# Patient Record
Sex: Female | Born: 1975 | Race: White | Hispanic: No | Marital: Married
Health system: Midwestern US, Academic
[De-identification: ages and names within clinical notes are randomized; demographics above are authoritative.]

---

## 2016-12-03 ENCOUNTER — Encounter: Admit: 2016-12-03 | Discharge: 2016-12-04 | Payer: BC Managed Care – PPO

## 2016-12-03 ENCOUNTER — Encounter: Admit: 2016-12-03 | Discharge: 2016-12-03 | Payer: BC Managed Care – PPO

## 2017-08-02 ENCOUNTER — Encounter: Admit: 2017-08-02 | Discharge: 2017-08-02 | Payer: BC Managed Care – PPO

## 2017-08-02 ENCOUNTER — Ambulatory Visit: Admit: 2017-08-02 | Discharge: 2017-08-02 | Payer: BC Managed Care – PPO

## 2017-08-02 DIAGNOSIS — H40052 Ocular hypertension, left eye: ICD-10-CM

## 2017-08-02 DIAGNOSIS — H35063 Retinal vasculitis, bilateral: Principal | ICD-10-CM

## 2017-08-02 MED ORDER — BRINZOLAMIDE 1 % OP DRPS
1 [drp] | Freq: Three times a day (TID) | OPHTHALMIC | 3 refills | 90.00000 days | Status: AC
Start: 2017-08-02 — End: 2018-07-12

## 2017-08-05 ENCOUNTER — Encounter: Admit: 2017-08-05 | Discharge: 2017-08-05 | Payer: BC Managed Care – PPO

## 2017-08-05 ENCOUNTER — Ambulatory Visit: Admit: 2017-08-05 | Discharge: 2017-08-05 | Payer: BC Managed Care – PPO

## 2017-08-05 DIAGNOSIS — H33321 Round hole, right eye: Principal | ICD-10-CM

## 2017-08-05 DIAGNOSIS — H33311 Horseshoe tear of retina without detachment, right eye: ICD-10-CM

## 2017-08-05 DIAGNOSIS — H40052 Ocular hypertension, left eye: ICD-10-CM

## 2017-08-05 DIAGNOSIS — H35063 Retinal vasculitis, bilateral: Principal | ICD-10-CM

## 2017-08-05 DIAGNOSIS — Z87891 Personal history of nicotine dependence: ICD-10-CM

## 2017-08-05 DIAGNOSIS — Z881 Allergy status to other antibiotic agents status: ICD-10-CM

## 2017-08-05 MED ORDER — TETRACAINE HCL (PF) 0.5 % OP DROP
0 refills | Status: DC
Start: 2017-08-05 — End: 2017-08-05
  Administered 2017-08-05: 18:00:00 1 [drp] via OPHTHALMIC

## 2017-08-05 MED ORDER — TROPICAMIDE 1 % OP DROP
1 [drp] | 0 refills | Status: CN
Start: 2017-08-05 — End: ?

## 2017-08-05 MED ORDER — PHENYLEPHRINE HCL 2.5 % OP DROP
1 [drp] | 0 refills | Status: CN
Start: 2017-08-05 — End: ?

## 2017-08-05 MED ORDER — CYCLOPENTOLATE 1 % OP DROP
1 [drp] | 0 refills | Status: CN
Start: 2017-08-05 — End: ?

## 2017-08-09 ENCOUNTER — Encounter: Admit: 2017-08-09 | Discharge: 2017-08-09 | Payer: BC Managed Care – PPO

## 2017-08-12 ENCOUNTER — Encounter: Admit: 2017-08-12 | Discharge: 2017-08-12 | Payer: BC Managed Care – PPO

## 2017-08-18 ENCOUNTER — Encounter: Admit: 2017-08-18 | Discharge: 2017-08-18 | Payer: BC Managed Care – PPO

## 2017-12-08 ENCOUNTER — Encounter: Admit: 2017-12-08 | Discharge: 2017-12-08 | Payer: BC Managed Care – PPO

## 2017-12-08 ENCOUNTER — Ambulatory Visit: Admit: 2017-12-08 | Discharge: 2017-12-09 | Payer: BC Managed Care – PPO

## 2017-12-08 DIAGNOSIS — H4052X1 Glaucoma secondary to other eye disorders, left eye, mild stage: ICD-10-CM

## 2017-12-08 DIAGNOSIS — H35063 Retinal vasculitis, bilateral: Principal | ICD-10-CM

## 2017-12-09 DIAGNOSIS — H5702 Anisocoria: ICD-10-CM

## 2017-12-26 ENCOUNTER — Encounter: Admit: 2017-12-26 | Discharge: 2017-12-26 | Payer: BC Managed Care – PPO

## 2017-12-26 ENCOUNTER — Ambulatory Visit: Admit: 2017-12-26 | Discharge: 2017-12-27 | Payer: BC Managed Care – PPO

## 2017-12-27 DIAGNOSIS — H40042 Steroid responder, left eye: Principal | ICD-10-CM

## 2017-12-29 ENCOUNTER — Encounter: Admit: 2017-12-29 | Discharge: 2017-12-29 | Payer: BC Managed Care – PPO

## 2017-12-30 ENCOUNTER — Encounter: Admit: 2017-12-30 | Discharge: 2017-12-30 | Payer: BC Managed Care – PPO

## 2018-01-02 ENCOUNTER — Encounter: Admit: 2018-01-02 | Discharge: 2018-01-02 | Payer: BC Managed Care – PPO

## 2018-01-02 DIAGNOSIS — T1592XA Foreign body on external eye, part unspecified, left eye, initial encounter: Principal | ICD-10-CM

## 2018-01-03 ENCOUNTER — Encounter: Admit: 2018-01-03 | Discharge: 2018-01-03 | Payer: BC Managed Care – PPO

## 2018-01-03 DIAGNOSIS — B399 Histoplasmosis, unspecified: Principal | ICD-10-CM

## 2018-01-04 ENCOUNTER — Encounter: Admit: 2018-01-04 | Discharge: 2018-01-04 | Payer: BC Managed Care – PPO

## 2018-01-04 ENCOUNTER — Ambulatory Visit: Admit: 2018-01-04 | Discharge: 2018-01-04 | Payer: BC Managed Care – PPO

## 2018-01-04 DIAGNOSIS — T380X5A Adverse effect of glucocorticoids and synthetic analogues, initial encounter: ICD-10-CM

## 2018-01-04 DIAGNOSIS — Z881 Allergy status to other antibiotic agents status: ICD-10-CM

## 2018-01-04 DIAGNOSIS — H40052 Ocular hypertension, left eye: ICD-10-CM

## 2018-01-04 DIAGNOSIS — H4062X0 Glaucoma secondary to drugs, left eye, stage unspecified: ICD-10-CM

## 2018-01-04 DIAGNOSIS — Z87891 Personal history of nicotine dependence: ICD-10-CM

## 2018-01-04 DIAGNOSIS — B399 Histoplasmosis, unspecified: Principal | ICD-10-CM

## 2018-01-04 DIAGNOSIS — T1582XA Foreign body in other and multiple parts of external eye, left eye, initial encounter: Principal | ICD-10-CM

## 2018-01-04 MED ORDER — TETRACAINE HCL (PF) 0.5 % OP DROP
0 refills | Status: DC
Start: 2018-01-04 — End: 2018-01-04
  Administered 2018-01-04: 15:00:00 3 [drp] via OPHTHALMIC

## 2018-01-04 MED ORDER — FENTANYL CITRATE (PF) 50 MCG/ML IJ SOLN
0 refills | Status: DC
Start: 2018-01-04 — End: 2018-01-04
  Administered 2018-01-04 (×2): 50 ug via INTRAVENOUS

## 2018-01-04 MED ORDER — BALANCED SALT SOLN NO.2 IRRIG. IO SOLN
0 refills | Status: DC
Start: 2018-01-04 — End: 2018-01-04
  Administered 2018-01-04: 16:00:00 15 mL via OPHTHALMIC

## 2018-01-04 MED ORDER — TOBRAMYCIN-DEXAMETHASONE 0.3-0.1 % OP OINT
0 refills | Status: DC
Start: 2018-01-04 — End: 2018-01-04
  Administered 2018-01-04: 16:00:00 0.5 [in_us] via OPHTHALMIC

## 2018-01-04 MED ORDER — TETRACAINE HCL (PF) 0.5 % OP DROP
1 [drp] | Freq: Once | OPHTHALMIC | 0 refills | Status: CP
Start: 2018-01-04 — End: ?
  Administered 2018-01-04: 15:00:00 1 [drp] via OPHTHALMIC

## 2018-01-04 MED ORDER — LIDOCAINE (PF) 10 MG/ML (1 %) IJ SOLN
0 refills | Status: DC
Start: 2018-01-04 — End: 2018-01-04
  Administered 2018-01-04: 16:00:00 .2 mL via INTRAMUSCULAR

## 2018-01-04 MED ORDER — MIDAZOLAM 1 MG/ML IJ SOLN
INTRAVENOUS | 0 refills | Status: DC
Start: 2018-01-04 — End: 2018-01-04
  Administered 2018-01-04 (×2): 2 mg via INTRAVENOUS

## 2018-01-04 MED ORDER — MOXIFLOXACIN 0.5 % OP DROP
1 [drp] | OPHTHALMIC | 0 refills | Status: CP
Start: 2018-01-04 — End: ?
  Administered 2018-01-04: 15:00:00 1 [drp] via OPHTHALMIC

## 2018-01-05 ENCOUNTER — Encounter: Admit: 2018-01-05 | Discharge: 2018-01-05 | Payer: BC Managed Care – PPO

## 2018-01-06 ENCOUNTER — Encounter: Admit: 2018-01-06 | Discharge: 2018-01-06 | Payer: BC Managed Care – PPO

## 2018-01-06 DIAGNOSIS — B399 Histoplasmosis, unspecified: Principal | ICD-10-CM

## 2018-01-19 ENCOUNTER — Ambulatory Visit: Admit: 2018-01-19 | Discharge: 2018-01-20 | Payer: BC Managed Care – PPO

## 2018-01-19 ENCOUNTER — Encounter: Admit: 2018-01-19 | Discharge: 2018-01-19 | Payer: BC Managed Care – PPO

## 2018-01-19 DIAGNOSIS — H04123 Dry eye syndrome of bilateral lacrimal glands: ICD-10-CM

## 2018-01-19 DIAGNOSIS — H35063 Retinal vasculitis, bilateral: ICD-10-CM

## 2018-01-19 DIAGNOSIS — H40042 Steroid responder, left eye: Principal | ICD-10-CM

## 2018-01-19 DIAGNOSIS — B399 Histoplasmosis, unspecified: Principal | ICD-10-CM

## 2018-01-23 ENCOUNTER — Ambulatory Visit: Admit: 2018-01-23 | Discharge: 2018-01-24 | Payer: BC Managed Care – PPO

## 2018-01-23 ENCOUNTER — Encounter: Admit: 2018-01-23 | Discharge: 2018-01-23 | Payer: BC Managed Care – PPO

## 2018-01-23 DIAGNOSIS — H33311 Horseshoe tear of retina without detachment, right eye: ICD-10-CM

## 2018-01-23 DIAGNOSIS — B399 Histoplasmosis, unspecified: Principal | ICD-10-CM

## 2018-01-23 MED ORDER — FLUORESCEIN 500 MG/2 ML (25 %) IV SOLN
2 mL | Freq: Once | INTRAVENOUS | 0 refills | Status: CP
Start: 2018-01-23 — End: ?
  Administered 2018-01-23: 23:00:00 2 mL via INTRAVENOUS

## 2018-01-24 DIAGNOSIS — H35063 Retinal vasculitis, bilateral: Principal | ICD-10-CM

## 2018-02-07 ENCOUNTER — Ambulatory Visit: Admit: 2018-02-07 | Discharge: 2018-02-08 | Payer: BC Managed Care – PPO

## 2018-02-07 ENCOUNTER — Encounter: Admit: 2018-02-07 | Discharge: 2018-02-07 | Payer: BC Managed Care – PPO

## 2018-02-07 DIAGNOSIS — H4052X1 Glaucoma secondary to other eye disorders, left eye, mild stage: Principal | ICD-10-CM

## 2018-02-07 DIAGNOSIS — H04123 Dry eye syndrome of bilateral lacrimal glands: ICD-10-CM

## 2018-02-07 DIAGNOSIS — B399 Histoplasmosis, unspecified: Principal | ICD-10-CM

## 2018-02-07 DIAGNOSIS — H40042 Steroid responder, left eye: ICD-10-CM

## 2018-02-07 DIAGNOSIS — H25042 Posterior subcapsular polar age-related cataract, left eye: ICD-10-CM

## 2018-02-07 MED ORDER — DIFLUPREDNATE 0.05 % OP DROP
1 [drp] | Freq: Four times a day (QID) | OPHTHALMIC | 3 refills | 19.00000 days | Status: AC
Start: 2018-02-07 — End: 2018-07-12

## 2018-02-07 MED ORDER — ACETAZOLAMIDE 500 MG PO CPER
500 mg | ORAL_CAPSULE | Freq: Two times a day (BID) | ORAL | 1 refills | 12.50000 days | Status: AC
Start: 2018-02-07 — End: ?

## 2018-02-08 DIAGNOSIS — H4052X1 Glaucoma secondary to other eye disorders, left eye, mild stage: Principal | ICD-10-CM

## 2018-02-13 DIAGNOSIS — H40052 Ocular hypertension, left eye: Secondary | ICD-10-CM

## 2018-02-23 ENCOUNTER — Encounter: Admit: 2018-02-23 | Discharge: 2018-02-23 | Payer: BC Managed Care – PPO

## 2018-02-23 DIAGNOSIS — B399 Histoplasmosis, unspecified: Principal | ICD-10-CM

## 2018-02-28 ENCOUNTER — Encounter: Admit: 2018-02-28 | Discharge: 2018-02-28 | Payer: BC Managed Care – PPO

## 2018-03-01 ENCOUNTER — Encounter: Admit: 2018-03-01 | Discharge: 2018-03-01 | Payer: BC Managed Care – PPO

## 2018-03-01 ENCOUNTER — Ambulatory Visit: Admit: 2018-03-01 | Discharge: 2018-03-01 | Payer: BC Managed Care – PPO

## 2018-03-01 DIAGNOSIS — H4052X1 Glaucoma secondary to other eye disorders, left eye, mild stage: Principal | ICD-10-CM

## 2018-03-01 DIAGNOSIS — Z87891 Personal history of nicotine dependence: ICD-10-CM

## 2018-03-01 DIAGNOSIS — Z881 Allergy status to other antibiotic agents status: ICD-10-CM

## 2018-03-01 DIAGNOSIS — H40042 Steroid responder, left eye: ICD-10-CM

## 2018-03-01 DIAGNOSIS — H40052 Ocular hypertension, left eye: ICD-10-CM

## 2018-03-01 DIAGNOSIS — Z90710 Acquired absence of both cervix and uterus: ICD-10-CM

## 2018-03-01 DIAGNOSIS — H25042 Posterior subcapsular polar age-related cataract, left eye: ICD-10-CM

## 2018-03-01 DIAGNOSIS — B399 Histoplasmosis, unspecified: Principal | ICD-10-CM

## 2018-03-01 MED ORDER — FLUORESCEIN 1 MG OP STRP
0 refills | Status: DC
Start: 2018-03-01 — End: 2018-03-01
  Administered 2018-03-01: 19:00:00 1 via OPHTHALMIC

## 2018-03-01 MED ORDER — CARBACHOL 0.01 % IO SOLN
0 refills | Status: DC
Start: 2018-03-01 — End: 2018-03-01
  Administered 2018-03-01: 18:00:00 1 mL via INTRAOCULAR

## 2018-03-01 MED ORDER — MITOMYCIN 0.4MG/ML OPHTH SYR
.4 mg | Freq: Once | SUBCONJUNCTIVAL | 0 refills | Status: CP
Start: 2018-03-01 — End: ?
  Administered 2018-03-01: 19:00:00 0.4 mg via SUBCONJUNCTIVAL

## 2018-03-01 MED ORDER — TOBRAMYCIN-DEXAMETHASONE 0.3-0.1 % OP OINT
0 refills | Status: DC
Start: 2018-03-01 — End: 2018-03-01
  Administered 2018-03-01: 19:00:00 0.5 [in_us] via OPHTHALMIC

## 2018-03-01 MED ORDER — MIDAZOLAM 1 MG/ML IJ SOLN
INTRAVENOUS | 0 refills | Status: DC
Start: 2018-03-01 — End: 2018-03-01
  Administered 2018-03-01 (×4): 1 mg via INTRAVENOUS
  Administered 2018-03-01: 18:00:00 2 mg via INTRAVENOUS

## 2018-03-01 MED ORDER — OBRIEN DILATION WITH CIPRO MIXTURE
1 [drp] | OPHTHALMIC | 0 refills | Status: DC
Start: 2018-03-01 — End: 2018-03-01
  Administered 2018-03-01: 15:00:00 1 [drp] via OPHTHALMIC

## 2018-03-01 MED ORDER — MOXIFLOXACIN 0.5 % OP DROP
0 refills | Status: DC
Start: 2018-03-01 — End: 2018-03-01
  Administered 2018-03-01: 20:00:00 0.1 mL via INTRACAMERAL

## 2018-03-01 MED ORDER — SODIUM HYALURONATE 10 MG/ML IO SYRG
0 refills | Status: DC
Start: 2018-03-01 — End: 2018-03-01
  Administered 2018-03-01 (×2): 0.85 mL via OPHTHALMIC

## 2018-03-01 MED ORDER — BUPIVACAINE/LIDOCAINE SYRINGE
0 refills | Status: DC
Start: 2018-03-01 — End: 2018-03-01
  Administered 2018-03-01 (×2): 3 mL via RETROBULBAR

## 2018-03-01 MED ORDER — DEXAMETHASONE SODIUM PHOSPHATE 4 MG/ML IJ SOLN
0 refills | Status: DC
Start: 2018-03-01 — End: 2018-03-01
  Administered 2018-03-01: 19:00:00 2 mg via SUBCONJUNCTIVAL

## 2018-03-01 MED ORDER — FENTANYL CITRATE (PF) 50 MCG/ML IJ SOLN
0 refills | Status: DC
Start: 2018-03-01 — End: 2018-03-01
  Administered 2018-03-01: 19:00:00 25 ug via INTRAVENOUS
  Administered 2018-03-01: 18:00:00 50 ug via INTRAVENOUS
  Administered 2018-03-01 (×2): 25 ug via INTRAVENOUS
  Administered 2018-03-01: 19:00:00 50 ug via INTRAVENOUS

## 2018-03-01 MED ORDER — VANCOMYCIN(#) INTRAVITREAL INJ 1MG/0.1ML
0 refills | Status: DC
Start: 2018-03-01 — End: 2018-03-01
  Administered 2018-03-01: 20:00:00 1 mg via INTRAVITREAL

## 2018-03-01 MED ORDER — BALANCED SALT SOLN NO.2 IRRIG. IO SOLN
0 refills | Status: DC
Start: 2018-03-01 — End: 2018-03-01
  Administered 2018-03-01: 18:00:00 15 mL via OPHTHALMIC

## 2018-03-01 MED ORDER — EPINEPHRINE 0.3 MG IN ISOTONIC OPHTH IRR(BSS)
0 refills | Status: DC
Start: 2018-03-01 — End: 2018-03-01
  Administered 2018-03-01 (×2): 500 mL via INTRAOCULAR

## 2018-03-01 MED ORDER — TETRACAINE HCL (PF) 0.5 % OP DROP
0 refills | Status: DC
Start: 2018-03-01 — End: 2018-03-01
  Administered 2018-03-01: 18:00:00 1 [drp] via OPHTHALMIC

## 2018-03-01 MED ORDER — TETRACAINE HCL (PF) 0.5 % OP DROP
1 [drp] | Freq: Once | OPHTHALMIC | 0 refills | Status: CP
Start: 2018-03-01 — End: ?
  Administered 2018-03-01: 15:00:00 1 [drp] via OPHTHALMIC

## 2018-03-01 MED ORDER — CHONDROITIN SULF-SOD HYALURON 3 %-4 %(0.5 ML) 1 % (0.55 ML) IO SYRG
0 refills | Status: DC
Start: 2018-03-01 — End: 2018-03-01
  Administered 2018-03-01: 18:00:00 1 via INTRAOCULAR

## 2018-03-02 ENCOUNTER — Ambulatory Visit: Admit: 2018-03-02 | Discharge: 2018-03-03 | Payer: BC Managed Care – PPO

## 2018-03-02 DIAGNOSIS — H40042 Steroid responder, left eye: ICD-10-CM

## 2018-03-02 DIAGNOSIS — H4052X1 Glaucoma secondary to other eye disorders, left eye, mild stage: Principal | ICD-10-CM

## 2018-03-03 ENCOUNTER — Ambulatory Visit: Admit: 2018-03-03 | Discharge: 2018-03-04 | Payer: BC Managed Care – PPO

## 2018-03-03 ENCOUNTER — Encounter: Admit: 2018-03-03 | Discharge: 2018-03-03 | Payer: BC Managed Care – PPO

## 2018-03-03 DIAGNOSIS — H40052 Ocular hypertension, left eye: ICD-10-CM

## 2018-03-03 DIAGNOSIS — H4052X1 Glaucoma secondary to other eye disorders, left eye, mild stage: Principal | ICD-10-CM

## 2018-03-03 DIAGNOSIS — B399 Histoplasmosis, unspecified: Principal | ICD-10-CM

## 2018-03-03 MED ORDER — ATROPINE 1 % OP DROP
1 [drp] | Freq: Two times a day (BID) | OPHTHALMIC | 0 refills | 18.00000 days | Status: AC
Start: 2018-03-03 — End: 2018-07-12

## 2018-03-07 ENCOUNTER — Ambulatory Visit: Admit: 2018-03-07 | Discharge: 2018-03-08 | Payer: BC Managed Care – PPO

## 2018-03-07 ENCOUNTER — Encounter: Admit: 2018-03-07 | Discharge: 2018-03-07 | Payer: BC Managed Care – PPO

## 2018-03-07 DIAGNOSIS — H4052X1 Glaucoma secondary to other eye disorders, left eye, mild stage: Principal | ICD-10-CM

## 2018-03-07 DIAGNOSIS — B399 Histoplasmosis, unspecified: Principal | ICD-10-CM

## 2018-03-13 ENCOUNTER — Ambulatory Visit: Admit: 2018-03-13 | Discharge: 2018-03-14 | Payer: BC Managed Care – PPO

## 2018-03-13 ENCOUNTER — Encounter: Admit: 2018-03-13 | Discharge: 2018-03-13 | Payer: BC Managed Care – PPO

## 2018-03-13 DIAGNOSIS — B399 Histoplasmosis, unspecified: Principal | ICD-10-CM

## 2018-03-13 DIAGNOSIS — H4052X1 Glaucoma secondary to other eye disorders, left eye, mild stage: Principal | ICD-10-CM

## 2018-03-13 DIAGNOSIS — H40042 Steroid responder, left eye: ICD-10-CM

## 2018-03-27 ENCOUNTER — Encounter: Admit: 2018-03-27 | Discharge: 2018-03-27 | Payer: BC Managed Care – PPO

## 2018-03-27 ENCOUNTER — Ambulatory Visit: Admit: 2018-03-27 | Discharge: 2018-03-28 | Payer: BC Managed Care – PPO

## 2018-03-27 DIAGNOSIS — H40042 Steroid responder, left eye: Secondary | ICD-10-CM

## 2018-03-27 DIAGNOSIS — Z9883 Filtering (vitreous) bleb after glaucoma surgery status: Secondary | ICD-10-CM

## 2018-03-27 DIAGNOSIS — H4052X1 Glaucoma secondary to other eye disorders, left eye, mild stage: Secondary | ICD-10-CM

## 2018-03-27 DIAGNOSIS — H40052 Ocular hypertension, left eye: Secondary | ICD-10-CM

## 2018-03-27 DIAGNOSIS — B399 Histoplasmosis, unspecified: Secondary | ICD-10-CM

## 2018-04-11 ENCOUNTER — Encounter: Admit: 2018-04-11 | Discharge: 2018-04-11 | Payer: BC Managed Care – PPO

## 2018-04-11 ENCOUNTER — Ambulatory Visit: Admit: 2018-04-11 | Discharge: 2018-04-12 | Payer: BC Managed Care – PPO

## 2018-04-11 DIAGNOSIS — H527 Unspecified disorder of refraction: ICD-10-CM

## 2018-04-11 DIAGNOSIS — H40052 Ocular hypertension, left eye: ICD-10-CM

## 2018-04-11 DIAGNOSIS — B399 Histoplasmosis, unspecified: Secondary | ICD-10-CM

## 2018-04-11 DIAGNOSIS — H35063 Retinal vasculitis, bilateral: ICD-10-CM

## 2018-04-11 MED ORDER — MOXIFLOXACIN 0.5 % OP DROP
1 [drp] | Freq: Four times a day (QID) | OPHTHALMIC | 0 refills | 7.00000 days | Status: AC
Start: 2018-04-11 — End: ?

## 2018-04-12 DIAGNOSIS — T85612A Breakdown (mechanical) of permanent sutures, initial encounter: Principal | ICD-10-CM

## 2018-05-11 ENCOUNTER — Encounter: Admit: 2018-05-11 | Discharge: 2018-05-11 | Payer: BC Managed Care – PPO

## 2018-05-12 ENCOUNTER — Ambulatory Visit: Admit: 2018-05-12 | Discharge: 2018-05-13 | Payer: BC Managed Care – PPO

## 2018-05-12 ENCOUNTER — Encounter: Admit: 2018-05-12 | Discharge: 2018-05-12 | Payer: BC Managed Care – PPO

## 2018-05-12 DIAGNOSIS — B399 Histoplasmosis, unspecified: Principal | ICD-10-CM

## 2018-05-12 NOTE — Progress Notes
There is no height or weight on file to calculate BMI.              Assessment and Plan:  A/ 1. Anisometropia    -OD low moderate myopia, OS high degree of astigmatism    -History of CE with PCIOL OS, had temporal corneal suture. Removed by Dr. Elpidio Anis    -post removal still has high degree of astigmatism, but regular    -Able to attain better than expect BCVA OS on refraction. Able to attain 20/20- OS         P/ 1. Prescription for glasses provided to patient. Discussed with patient potential for difficulty for adaption to glasses due to high degree of anisometropia. Advised witting Rx for SV glasses. Near add indicated OS, but would be very difficult to adapt to in glasses. Will forgo bifocal OS at this time. Advised can return to reassess glasses PRN if having difficulty adapting to glasses. Educated on potential for contact lenses, may reduce visual distortion posed by aniso. Patient very apprehensive to contact lens. Monitor PRN.

## 2018-05-13 DIAGNOSIS — H5231 Anisometropia: Principal | ICD-10-CM

## 2018-07-12 ENCOUNTER — Ambulatory Visit: Admit: 2018-07-12 | Discharge: 2018-07-13 | Payer: BC Managed Care – PPO

## 2018-07-12 ENCOUNTER — Encounter: Admit: 2018-07-12 | Discharge: 2018-07-12 | Payer: BC Managed Care – PPO

## 2018-07-12 DIAGNOSIS — H4052X1 Glaucoma secondary to other eye disorders, left eye, mild stage: Principal | ICD-10-CM

## 2018-07-12 DIAGNOSIS — H40042 Steroid responder, left eye: ICD-10-CM

## 2018-07-12 DIAGNOSIS — B399 Histoplasmosis, unspecified: Principal | ICD-10-CM

## 2018-07-12 NOTE — Progress Notes
There is no height or weight on file to calculate BMI.There is no height or weight on file to calculate BMI.There is no height or weight on file to calculate BMI.         ASSESSMENT:     Steroid Responder / OHTN, OS  -s/p CE + PCIOL + trabeculectomy/[facility] (03/01/18 OS)  -CCT thin, iop off gtts is excellent, bleb looks great   -FHx (-), Trauma (-), Steroids (-)  -HVF 10/19 OCT 10/19 DFE 10/19 GONIO 10/19 FP x   ???  Hx of NAION, OS  ???  Hx of Corneal Ulcer, OS  -many years ago  ???  Lasik, OU  ???  Retinal Vasculitis, OU  -etiology unknown   -had oral steroids and STK in [place]  -still on short course of PO steroids today for bronchitis    DES OS>OD        PLAN:    IOP great off gtts  One nylon stitch exposed, will remove today - betadine instilled first, sterile vannas forceps and scissors used to remove, seidel neg, vigamox instilled after  Use Vigamox TID x 3 days, tobradex ung provided for irritation    NEXT VISIT:  3 mos, iop check, oct-n

## 2018-08-03 ENCOUNTER — Encounter: Admit: 2018-08-03 | Discharge: 2018-08-03 | Payer: BC Managed Care – PPO

## 2018-08-03 ENCOUNTER — Ambulatory Visit: Admit: 2018-08-03 | Discharge: 2018-08-04 | Payer: BC Managed Care – PPO

## 2018-08-03 DIAGNOSIS — H547 Unspecified visual loss: ICD-10-CM

## 2018-08-03 DIAGNOSIS — B399 Histoplasmosis, unspecified: Principal | ICD-10-CM

## 2018-08-03 DIAGNOSIS — G43909 Migraine, unspecified, not intractable, without status migrainosus: ICD-10-CM

## 2018-08-03 NOTE — Progress Notes
Date of Service: 08/03/2018    Subjective:             Brooke Zuniga is a 43 y.o. female.    History of Present Illness  Brooke Zuniga is a 43 y.o. right handed Caucasian female who presents here with a history of vertigo which first started  She has history of migraines since the age of 89 almost always on the left forehead , no auras but with nausea and vomiting. Mild light sensitivity. She had hysterectomy and the migraines improved. 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 She started having episodic vertigo in October that could be anytime of the day and no clear trigger lasting for minutes to 20-30 minutes and it could be in any position, she can range from 0-2 episodes per week and no post headache and no history of trauma . No hearing loss, tinnitus or any fullness sensation. She saw an ENT specialist and she had vestibular testing that was -ve and she was told she needs a neurologist. No cognition complaint, no numbness and no weakness.       Previous Studies(These are my personal notes on reviewing the MRI images and not an interpretation):  MRI Brain (2018): non specific mild flair spots consistent with   MRI spine: not done         Allergies   Allergen Reactions   ??? Ceclor [Cefaclor] RASH     Anaphylaxis         Current Outpatient Medications:   ???  fexofenadine HCl (ALLEGRA PO), Take  by mouth., Disp: , Rfl:   ???  levalbuterol tartrate(+) (XOPENEX HFA) 45 mcg/actuation inhaler, Inhale 2 puffs by mouth into the lungs every 4-6 hours as needed for Wheezing or Shortness of Breath., Disp: , Rfl:   ???  SUMAtriptan succinate (IMITREX) 25 mg tablet, Take 25 mg by mouth daily as needed., Disp: , Rfl:      Medical History:   Diagnosis Date   ??? Histoplasmosis 2008   ??? Migraines    ??? Vision decreased        Family History Mental Status Exam: Alert oriented to time, person and place,    Speech and Language: Intact for fluency, reception and repetition     Dix halpike -ve   Cervical trigger point -ve     Cranial Nerve Exam:     Cranial Nerve II Right Left   Visual Acuity 20/20 20/70   Pupil  Equally reactive to light No RAPD Equally reactive to light No RAPD   Fundoscopy  No pallor No papilledema  No pallor mild papilledema    Visual Fields No deficit detected No deficit detected     Cranial Nerves III-XII Right Left   III, IV, VI (EOM's) Intact  Intact    V Intact  Intact    VII Intact  Intact    VIII Intact  Intact    IX, X Intact  Intact    XI Intact  Intact    XII Intact  Intact        Motor:    R L   R L   Shoulder Abductors 5 5  Hip flexor 5 5   Elbow flexors 5 5       Elbow extensors 5 5  Knee flexors 5 5  Wrist flexors 5 5  Knee extensors 5 5   Wrist extensors 5 5  Ankle dorsiflexors 5 5   Finger flexors 5 5  Ankle plantar flexors 5 5   Finger abductors 5 5       Fingers extensors 5 5         Bulk and Tone:    Upper Extremity R L   Atrophy Intact  Intact    Increased Tone 0/4 0/4     Lower Extremity R L   Atrophy Intact  Intact    Increased Tone 0/4 0/4     Reflexes      R L   Biceps ++ ++   Brachioradialis ++ ++   Triceps ++ ++   Knee ++ ++   Ankle ++ ++     Reflexes R L   Hoffman's -ve  -ve   Plantar Equivocal  Equivocal        Sensory Examination Temp:  R L   Upper Extremity Intact  Intact    Lower Extremity Intact  Intact         Sensory Examination: Joint position sense    R L   Fingers Intact  Intact    Toes Intact  Intact    Ankle       Sensory examination: Vibration in Seconds    R L   Fingers 18 secs  18 secs    Toes 14 secs  14  secs    Ankle       Cerebellar/Fine Motor R L   Finger nose finger Intact  Intact    Heel to shin Intact  Intact    Rapid alternating movements Intact  Intact      Gait: narrow based no ataxia, able to tandem   Romberg: -ve       Assessment and Plan:    Problem   Vertigo        Vertigo The patient has new onset episodic vertigo with no clear trigger, position relation or aura   The neurological examination is unremarkable today and no indication of a movement induced vertigo  I discussed with the patient that her symptoms could be due to a basilar variant of migraines   Due to her pervious histoy if vasculitis I will proceed with MRI of the brain and MRA of the brain to rule out a vascular cause.   I recommend   - Trial of Magnesium oxide 400 mg po daily and riboflavin 400 mg po daily.    - Keep diary of episode triggers, position and duration

## 2018-08-03 NOTE — Patient Instructions
1- Trial of Magnesium oxide 400 mg po daily and riboflavin 400 mg po daily.   2- MRI of the brain and MRA of the head   3- Keep diary of episode triggers, position and duration

## 2018-08-04 DIAGNOSIS — R42 Dizziness and giddiness: Principal | ICD-10-CM

## 2018-08-13 ENCOUNTER — Encounter: Admit: 2018-08-13 | Discharge: 2018-08-13 | Payer: BC Managed Care – PPO

## 2018-08-20 ENCOUNTER — Ambulatory Visit: Admit: 2018-08-20 | Discharge: 2018-08-20

## 2018-08-20 ENCOUNTER — Encounter: Admit: 2018-08-20 | Discharge: 2018-08-20

## 2018-08-20 DIAGNOSIS — R42 Dizziness and giddiness: Principal | ICD-10-CM

## 2018-08-20 MED ORDER — GADOBENATE DIMEGLUMINE 529 MG/ML (0.1MMOL/0.2ML) IV SOLN
16 mL | Freq: Once | INTRAVENOUS | 0 refills | Status: CP
Start: 2018-08-20 — End: ?
  Administered 2018-08-20: 17:00:00 16 mL via INTRAVENOUS

## 2018-08-29 ENCOUNTER — Encounter: Admit: 2018-08-29 | Discharge: 2018-08-29

## 2018-08-30 ENCOUNTER — Ambulatory Visit: Admit: 2018-08-30 | Discharge: 2018-08-31

## 2018-08-30 ENCOUNTER — Encounter: Admit: 2018-08-30 | Discharge: 2018-08-30

## 2018-08-30 DIAGNOSIS — B399 Histoplasmosis, unspecified: Secondary | ICD-10-CM

## 2018-08-30 DIAGNOSIS — G43909 Migraine, unspecified, not intractable, without status migrainosus: Secondary | ICD-10-CM

## 2018-08-30 DIAGNOSIS — H547 Unspecified visual loss: Secondary | ICD-10-CM

## 2018-08-30 DIAGNOSIS — I671 Cerebral aneurysm, nonruptured: Secondary | ICD-10-CM

## 2018-08-30 MED ORDER — TOPIRAMATE 25 MG PO TAB
ORAL_TABLET | Freq: Every day | 1 refills | Status: DC
Start: 2018-08-30 — End: 2018-08-30

## 2018-08-30 NOTE — Telephone Encounter
Patient

## 2018-08-30 NOTE — Assessment & Plan Note
Incidental on MRA of the head   I will refer her to [facility] for review.

## 2018-08-30 NOTE — Telephone Encounter
Patient called and states she has seen a neurologist and they want to put her on med's that may effect glaucoma and she wants to speak with Dr.Alapati prior to accepting these medications. Please return call to patient the med's are topiramate and amitriptyline

## 2018-08-30 NOTE — Telephone Encounter
Spoke with patient and let her know that being on Topiramate or Amitriptyline should not be a problem with the form of glaucoma she has (per Dr. Desma Maxim). Patient voiced understanding.    No further action needed.

## 2018-08-30 NOTE — Patient Instructions
1- ask your ophthalmologist about Topiramate and hx of glucoma   2- ask ophthalmologist and PCP about using amitriptyline   3- If not recommended use Co-Q10 100mg  increased by 100mg  every 3 days to a max dose of 300mg  by mouth daily   4- book appt with Dr.Abraham

## 2018-08-31 DIAGNOSIS — R42 Dizziness and giddiness: Principal | ICD-10-CM

## 2018-08-31 MED ORDER — TOPIRAMATE 25 MG PO TAB
ORAL_TABLET | Freq: Every day | 3 refills | Status: DC
Start: 2018-08-31 — End: 2018-11-22

## 2018-08-31 NOTE — Telephone Encounter
Per Dr.Jassam pt is to start topiramate 25mg  by mouth daily for 1 week and then the next week take 25mg  twice a day and the third week increase by another 25mg  and the following week in increase by another 25mg  for a total of 50mg  bid. Instructed pt via mychart message to monitor for vision changes and to see her ophthalmologist to check pressure in one month. Also left a voicemail.

## 2018-09-19 ENCOUNTER — Encounter: Admit: 2018-09-19 | Discharge: 2018-09-19

## 2018-09-19 DIAGNOSIS — H33311 Horseshoe tear of retina without detachment, right eye: Secondary | ICD-10-CM

## 2018-09-19 DIAGNOSIS — H5702 Anisocoria: Secondary | ICD-10-CM

## 2018-09-19 DIAGNOSIS — H40052 Ocular hypertension, left eye: Secondary | ICD-10-CM

## 2018-09-19 DIAGNOSIS — G43909 Migraine, unspecified, not intractable, without status migrainosus: Secondary | ICD-10-CM

## 2018-09-19 DIAGNOSIS — H547 Unspecified visual loss: Secondary | ICD-10-CM

## 2018-09-19 DIAGNOSIS — H04123 Dry eye syndrome of bilateral lacrimal glands: Secondary | ICD-10-CM

## 2018-09-19 DIAGNOSIS — B399 Histoplasmosis, unspecified: Secondary | ICD-10-CM

## 2018-09-19 NOTE — Progress Notes
Body mass index is 25.09 kg/m???.           Optos:  OD: No retinal tear, no retinal detachment, no vitreous hemorrhage, retina all attached. Pigment mottling along sup-temp arcade  OS: No retinal tear, no retinal detachment, no vitreous hemorrhage, retina all attached.  ???  OCT:  OD: Preserved foveal contour with distinguished retinal layers.  OS: Preserved foveal contour with distinguished retinal layers.       Assessment and Plan:         HX of steroids shot in OS 02/2018 with increased IOP since then  S/p STK removal by Dr.Alalapti 01/04/2018 due to high IOP  ???  Hx of anterior ischemic optic neuropathy  Carotid doppler showed minima plpaque, mild atherosclerosis (<50% stenosis) 12/2016  MRI brain: few foci of white matter signal abnormality, could be related to microvascular ischemic changes, demyelinating disease, trauma, migraine, or other etiologies.  ???    1- Anisocoria OS  Seen with Dr.Whittaker  Hx of migraines, and care sick  Older photos checked  Equal light response OU  Plan for [facility] OCT???as per Dr.Whittaker  Patient to bring old MRI scans from [place] to be reviewed by Dr.Whittaker as requested  Monitor for now  ???  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 Pt had extensive w/u in [place].???  Was started on prednison 60mg  but was not enough so STK was done OS 02/2017  No vasculitis seen on ???FA (11/2017)  Retinal hge nasal to ONH  Need to get records from [facility] [place]  Rheumatology consult here to establish care and systemic work up  ???  3- Retinal tear OD  Sup-temp  Complete surrounding pigmentation  S/p??????prophylactic laser ttt  Monitor???  ???  4-???Ocular hypertension, left  IOP OS today=???92mmHg  Continue???topical gtts as per Dr.Alapati (Azopt, combigan, latanoprost)  ???  5- Refractive error OU  Referral  to optometry clinic for possible new MRx???  ???  6- Dry eye syndrome Ou  ATs and compresses (sample provided)  F/u Dr.Alapati  ??? Signs and symptoms of retinal tears and retinal detachment were reviewed in details with the patient. Patient was instructed to immediately present for evaluation or seek medical help if increased flashes, increased floaters, any decrease in vision, or curtain in field of vision.  ???  ???  F/u???retina???1y, or sooner prn  Oct, optos  ???

## 2018-09-20 ENCOUNTER — Ambulatory Visit: Admit: 2018-09-19 | Discharge: 2018-09-20

## 2018-09-20 DIAGNOSIS — H35063 Retinal vasculitis, bilateral: Secondary | ICD-10-CM

## 2018-09-21 ENCOUNTER — Encounter: Admit: 2018-09-21 | Discharge: 2018-09-21

## 2018-09-21 NOTE — Telephone Encounter
Patient called asking when she would be scheduled with Dr. Tammi Klippel.  Informed patient that Dr. Marilynne Drivers next available clinic dates will be in September 2020 and that he reviewed her chart and states "This pat's aneurysm is 1-2 mm so no rush as there is no indication to treat due to small size." Patient verbalized understanding and denied having any further questions at this time.

## 2018-10-12 ENCOUNTER — Encounter: Admit: 2018-10-12 | Discharge: 2018-10-12

## 2018-11-14 ENCOUNTER — Encounter: Admit: 2018-11-14 | Discharge: 2018-11-14

## 2018-11-19 ENCOUNTER — Encounter: Admit: 2018-11-19 | Discharge: 2018-11-19

## 2018-11-22 MED ORDER — TOPIRAMATE 25 MG PO TAB
50 mg | ORAL_TABLET | Freq: Two times a day (BID) | ORAL | 1 refills | Status: DC
Start: 2018-11-22 — End: 2018-12-07

## 2018-11-22 NOTE — Telephone Encounter
Refill request for topiramate. Last office visit 08/30/18. RX included in plan of care. Refill sent

## 2018-11-27 ENCOUNTER — Encounter: Admit: 2018-11-27 | Discharge: 2018-11-27 | Payer: BC Managed Care – PPO

## 2018-11-27 ENCOUNTER — Ambulatory Visit: Admit: 2018-11-27 | Discharge: 2018-11-28 | Payer: BC Managed Care – PPO

## 2018-11-27 NOTE — Progress Notes
There is no height or weight on file to calculate BMI.         ASSESSMENT:     Steroid Responder / OHTN, OS  -s/p CE + PCIOL + trabeculectomy/[facility] (03/01/18 OS)  -CCT thin, iop off gtts is excellent, bleb looks great   -FHx (-), Trauma (-), Steroids (-)  -HVF 10/19 OCT 9/20 DFE 10/19 GONIO 10/19 FP x     Hx of NAION, OS    Hx of Corneal Ulcer, OS  -many years ago    Lasik, OU    Retinal Vasculitis, OU  -etiology unknown   -had oral steroids and STK in [place]    DES OS>OD      PLAN:    IOP great off gtts, can continue off drops  Continue to follow-up with Dr. Miguel Aschoff as needed      NEXT VISIT:  4-6 months, IOP check, HVF, oct-n, DFE     Conchita Paris, MD  PGY-3 Ophthalmology    I personally examined and managed this patient with the resident and agree with their assessment and plan. See below for any addendums.       Base Eye Exam     Visual Acuity (Snellen - Linear)       Right Left    Dist cc 20/20 20/50    Dist ph cc  20/25    Correction:  Glasses          Tonometry (Tonopen, 8:17 AM)       Right Left    Pressure 14 11          Tonometry #2 (Applanation, 8:46 AM)       Right Left    Pressure 12 11          Neuro/Psych     Oriented x3:  Yes    Mood/Affect:  Normal            Slit Lamp and Fundus Exam     Slit Lamp Exam       Right Left    Lids/Lashes Normal Normal    Conjunctiva/Sclera White and quiet White and quiet    Cornea Clear Clear    Anterior Chamber Deep and quiet Deep and quiet    Iris Flat Flat, patent superior PI    Lens Clear Clear    Vitreous Normal Normal          Fundus Exam       Right Left    Disc Sharp, healthy rim sharp, healthy rim    C/D Ratio 0.1 0.1            Refraction     Wearing Rx       Sphere Cylinder Axis    Right -1.75 Sphere     Left -4.25 +5.25 108    Type:  SVL

## 2018-11-29 ENCOUNTER — Encounter: Admit: 2018-11-29 | Discharge: 2018-11-29 | Payer: BC Managed Care – PPO

## 2018-11-29 NOTE — Telephone Encounter
Called pt regarding appt tomorrow with Dr. Tammi Klippel. All questions answered directions given.

## 2018-11-30 ENCOUNTER — Ambulatory Visit: Admit: 2018-11-30 | Discharge: 2018-12-01 | Payer: BC Managed Care – PPO

## 2018-11-30 ENCOUNTER — Encounter: Admit: 2018-11-30 | Discharge: 2018-11-30 | Payer: BC Managed Care – PPO

## 2018-11-30 NOTE — Patient Instructions
1) You have a small aneurysm vs infundibulum (small outpouching at the origin of an artery) in the back of your brain that measures 1-2 mm.  If it is truly an aneurysm, this risk of bleeding is <0.1% per year so no indication to treat.  Plan for CT-angiogram in Dec 2020.  If that is stable then I will see you in Dec 2021 with a pre-clinic CT-angiogram.    Please call Julien Girt, my nurse, with any questions at [phone].  Please call Westley Hummer, my scheduler, with any scheduling questions at [phone].

## 2018-11-30 NOTE — Progress Notes
Date of Service: 11/30/2018    Subjective:             Brooke Zuniga is a 43 y.o. female who presents to clinic with her husband as a new pt from Dr. Charline Bills for a cerebral aneurysm.    History of Present Illness  She saw Dr. 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    FH:  No aneurysms; PGM stroke due to carotid disease, tobacco; F ocular stroke; no ICH  SH:  Occasional cigars, tobacco quit 20 years ago; works at [employer] in [place], [place]       Review of Systems   Neurological: Positive for dizziness.   Psychiatric/Behavioral: The patient is nervous/anxious.    All other systems reviewed and are negative.        Objective:         ? fexofenadine HCl (ALLEGRA PO) Take  by mouth.   ? levalbuterol tartrate(+) (XOPENEX HFA) 45 mcg/actuation inhaler Inhale 2 puffs by mouth into the lungs every 4-6 hours as needed for Wheezing or Shortness of Breath.   ? riboflavin (VITAMIN B-2) 100 mg tab Take 100 mg by mouth daily.   ? SUMAtriptan succinate (IMITREX) 25 mg tablet Take 25 mg by mouth daily as needed.   ? topiramate (TOPAMAX) 25 mg tablet Take two tablets by mouth twice daily.     Vitals:    11/30/18 0839   BP: 110/69   BP Source: Arm, Right Upper   Patient Position: Sitting   Pulse: 47   Resp: (P) 14   Temp: 36.7 ?C (98 ?F)   TempSrc: Temporal   Weight: 75.9 kg (167 lb 6.4 oz)   Height: (P) 172.7 cm (68)   PainSc: (P) Zero     Body mass index is 25.45 kg/m? (pended).     Physical Exam  Neuro:  MS:  A & O X 3, Speech:  fluent, CN:  2-12 intact, Motor:  MAE, Gait:  normal        NEUROIMAGING  MRA-BRAIN (08-20-18)  IMPRESSION       MR brain:     Scattered foci of nonspecific supratentorial white matter FLAIR   hyperintensity in a bifrontal and subcortical predominant distribution,   possibly due to clinically reported migraine headaches. No abnormal   intracranial enhancing lesions.     MRA head: Tiny vascular infundibulum versus a 1 to 2 mm aneurysm along the posterior   proximal left P1 segment. No significant intracranial arterial stenosis,   occlusion, or arteriovenous malformation.         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 Brooke Zuniga and her husband with the key images highlighted above and discussed the characteristics of the aneurysm which can affect the risk of rupture, including size and shape.  Her aneurysm is approximately 1-2 mm in diameter.  Her PHASES score is 4 (this is only if her finding is truly an aneurysm, which gives a 0.9% risk of rupture over the next 5 years.  Her aneurysm is also saccular in shape, which also has a lower risk of rupture when compared to multi-lobulated aneurysms.  Given the above, we can follow her aneurysm with a CTA-head in Dec 2020.  There is no indication for a formal cerebral angiogram at this time.  I did discuss potential treatment options of endovascular coil embolization and neurosurgical clipping.  If the follow  up CTA demonstrates growth or change in the shape of the aneurysm, we will proceed a formal cerebral angiogram.   They understand and agree with the above plan.  If the Dec 2020 CTA is stable I will see her in clinic in Dec 2021 with a pre-clinic CTA.    -Risk factor modification per PCP   -Goal systolic BP <130   -Goal LDL <100   -Goal A1C <7      Thank you for allowing me to participate in Brooke Zuniga care.                          Total time 45 minutes.  Estimated counseling time 30 minutes.  Counseled Mrs. Dolph regarding her MRA finding.

## 2018-12-07 ENCOUNTER — Ambulatory Visit: Admit: 2018-12-07 | Discharge: 2018-12-08 | Payer: BC Managed Care – PPO

## 2018-12-07 ENCOUNTER — Encounter: Admit: 2018-12-07 | Discharge: 2018-12-07 | Payer: BC Managed Care – PPO

## 2018-12-07 MED ORDER — TOPIRAMATE 25 MG PO TAB
ORAL_TABLET | 1 refills | Status: DC
Start: 2018-12-07 — End: 2019-05-07

## 2018-12-07 NOTE — Assessment & Plan Note
Follow up with Dr.Abraham after CTA in December

## 2018-12-07 NOTE — Progress Notes
Date of Service: 12/07/2018    Subjective:             Brooke Zuniga is a 43 y.o. female.    History of Present Illness    Events since last visit:-  Brooke Zuniga is here for follow up today, vertigo has improved significantly since starting the topiramate , she is now on 50mg  bid and tolerating it. The eye pressure has been reported as normal and no increased suicidal ideation, She still however has some sensation of being Floaty or slightly throughout the day but her symptoms are more manageable. She followed up with Dr.Abraham and she has a CTA in December           Current Outpatient Medications:   ?  fexofenadine HCl (ALLEGRA PO), Take  by mouth., Disp: , Rfl:   ?  levalbuterol tartrate(+) (XOPENEX HFA) 45 mcg/actuation inhaler, Inhale 2 puffs by mouth into the lungs every 4-6 hours as needed for Wheezing or Shortness of Breath., Disp: , Rfl:   ?  riboflavin (VITAMIN B-2) 100 mg tab, Take 100 mg by mouth daily., Disp: , Rfl:   ?  SUMAtriptan succinate (IMITREX) 25 mg tablet, Take 25 mg by mouth daily as needed., Disp: , Rfl:   ?  topiramate (TOPAMAX) 25 mg tablet, Take 75mg  in the morning and 50mg  at bed time, Disp: 360 tablet, Rfl: 1     Allergies   Allergen Reactions   ? Ceclor [Cefaclor] RASH     Anaphylaxis       Medical History:   Diagnosis Date   ? Histoplasmosis 2008   ? Migraines    ? Vision decreased        Family History   Problem Relation Age of Onset   ? Cataract Father    ? Diabetes Father    ? Stroke Father    ? Cancer Mother    ? Migraines Mother    ? Migraines Sister    ? Diabetes Maternal Grandmother    ? Diabetes Maternal Grandfather    ? Diabetes Paternal Grandmother    ? Stroke Paternal Grandmother    ? Diabetes Paternal Grandfather    ? Cancer Maternal Aunt    ? Migraines Maternal Aunt    ? Neuropathy Maternal Aunt    ? Glaucoma Neg Hx    ? Macular Degen Neg Hx        Social History     Tobacco Use   Smoking Status Current Some Day Smoker   ? Types: Cigars Smokeless Tobacco Never Used   Tobacco Comment    cigars once in a while     Social History     Substance and Sexual Activity   Alcohol Use Yes   ? Frequency: 2-4 times a month    Comment: occaisonally              Review of Systems   Neurological: Positive for dizziness (sometimes).   Psychiatric/Behavioral: Positive for sleep disturbance (sometimes).   All other systems reviewed and are negative.        Objective:         ? fexofenadine HCl (ALLEGRA PO) Take  by mouth.   ? levalbuterol tartrate(+) (XOPENEX HFA) 45 mcg/actuation inhaler Inhale 2 puffs by mouth into the lungs every 4-6 hours as needed for Wheezing or Shortness of Breath.   ? riboflavin (VITAMIN B-2) 100 mg tab Take 100 mg by mouth daily.   ? SUMAtriptan succinate (  IMITREX) 25 mg tablet Take 25 mg by mouth daily as needed.   ? topiramate (TOPAMAX) 25 mg tablet Take 75mg  in the morning and 50mg  at bed time     Vitals:    12/07/18 1404   BP: 104/68   BP Source: Arm, Left Upper   Patient Position: Sitting   Pulse: 68   Temp: 36.8 ?C (98.3 ?F)   Weight: 77.6 kg (171 lb)   Height: 172.7 cm (68)   PainSc: Zero     Body mass index is 26 kg/m?Marland Kitchen     Physical Exam  Depression Screening was performed on Brooke Zuniga in clinic today. Based on the score of 0, no follow up action or recommendations are necessary at this time.    Physical Exam  Mental Status Exam:?Alert oriented to time, person and place,  ?  Speech and Language: Intact for fluency, reception and repetition?  ?  Dix halpike -ve     ?  Cranial Nerve Exam:   ?  Cranial Nerve II Right Left   Visual Acuity 20/20 20/70   ?  Cranial Nerves III-XII Right Left   III, IV, VI (EOM's) Intact  Intact    V Intact  Intact    VII Intact  Intact    VIII Intact  Intact    IX, X Intact  Intact    XI Intact  Intact    XII Intact  Intact    ?  ?  ? ? ? ? ? ? ?   Motor:   ? R L ? ? R L   Shoulder Abductors 5 5 ? Hip?flexor 5 5   Elbow flexors 5 5 ? ? ? ?   Elbow extensors 5 5 ? Knee flexors 5 5 Wrist flexors 5 5 ? Knee extensors 5 5   Wrist extensors 5 5 ? Ankle dorsiflexors 5 5   Finger flexors 5 5 ? Ankle plantar flexors 5 5   Finger abductors 5 5 ? ? ? ?   Fingers extensors 5 5 ? ? ? ?   ?  Bulk and Tone:  ?  Upper Extremity R L   Atrophy Intact  Intact    Increased Tone 0/4 0/4   ?  Lower Extremity R L   Atrophy Intact  Intact    Increased Tone 0/4 0/4   ?  ?  Sensory Examination Temp:  R L   Upper Extremity Intact  Intact    Lower Extremity Intact  Intact       ?  Cerebellar/Fine Motor R L   Finger nose finger Intact  Intact    Heel to shin Intact  Intact    Rapid alternating movements Intact  Intact    ?  Gait:?narrow based no ataxia, able to tandem   Romberg: -ve         Assessment and Plan:    Problem   Aneurysm of Posterior Cerebral Artery   Vertigo    MR brain:    Scattered foci of nonspecific supratentorial white matter FLAIR   hyperintensity in a bifrontal and subcortical predominant distribution,   possibly due to clinically reported migraine headaches. No abnormal   intracranial enhancing lesions.    MRA head:    Tiny vascular infundibulum versus a 1 to 2 mm aneurysm along the posterior   proximal left P1 segment. No significant intracranial arterial stenosis,   occlusion, or arteriovenous malformation.    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

## 2018-12-08 LAB — CBC AND DIFF
Lab: 0 10*3/uL (ref 0–0.20)
Lab: 0 10*3/uL (ref 0–0.45)
Lab: 0.3 10*3/uL (ref 0–0.80)
Lab: 1 % (ref 0–2)
Lab: 1 % (ref 0–5)
Lab: 105 FL — ABNORMAL HIGH (ref 80–100)
Lab: 12 g/dL (ref 12.0–15.0)
Lab: 13 % (ref 11–15)
Lab: 2.3 10*3/uL (ref 1.0–4.8)
Lab: 255 10*3/uL (ref 150–400)
Lab: 3.2 10*3/uL (ref 1.8–7.0)
Lab: 3.4 M/UL — ABNORMAL LOW (ref 4.0–5.0)
Lab: 33 g/dL (ref 32.0–36.0)
Lab: 35 % — ABNORMAL LOW (ref 36–45)
Lab: 35 pg — ABNORMAL HIGH (ref 26–34)
Lab: 38 % (ref 24–44)
Lab: 54 % (ref 41–77)
Lab: 6 % (ref 4–12)
Lab: 6.1 10*3/uL (ref 4.5–11.0)
Lab: 8.7 FL (ref 7–11)

## 2019-02-20 ENCOUNTER — Ambulatory Visit: Admit: 2019-02-20 | Discharge: 2019-02-20 | Payer: BC Managed Care – PPO

## 2019-02-20 ENCOUNTER — Encounter: Admit: 2019-02-20 | Discharge: 2019-02-20 | Payer: BC Managed Care – PPO

## 2019-02-20 DIAGNOSIS — I671 Cerebral aneurysm, nonruptured: Secondary | ICD-10-CM

## 2019-02-20 MED ORDER — SODIUM CHLORIDE 0.9 % IJ SOLN
50 mL | Freq: Once | INTRAVENOUS | 0 refills | Status: CP
Start: 2019-02-20 — End: ?
  Administered 2019-02-20: 20:00:00 50 mL via INTRAVENOUS

## 2019-02-20 MED ORDER — IOHEXOL 350 MG IODINE/ML IV SOLN
80 mL | Freq: Once | INTRAVENOUS | 0 refills | Status: CP
Start: 2019-02-20 — End: ?
  Administered 2019-02-20: 20:00:00 80 mL via INTRAVENOUS

## 2019-02-22 ENCOUNTER — Encounter: Admit: 2019-02-22 | Discharge: 2019-02-22 | Payer: BC Managed Care – PPO

## 2019-03-13 ENCOUNTER — Encounter: Admit: 2019-03-13 | Discharge: 2019-03-13 | Payer: BC Managed Care – PPO

## 2019-03-13 ENCOUNTER — Ambulatory Visit: Admit: 2019-03-13 | Discharge: 2019-03-13 | Payer: BC Managed Care – PPO

## 2019-03-13 DIAGNOSIS — R42 Dizziness and giddiness: Secondary | ICD-10-CM

## 2019-03-13 DIAGNOSIS — B399 Histoplasmosis, unspecified: Secondary | ICD-10-CM

## 2019-03-13 DIAGNOSIS — D7589 Other specified diseases of blood and blood-forming organs: Secondary | ICD-10-CM

## 2019-03-13 DIAGNOSIS — G43909 Migraine, unspecified, not intractable, without status migrainosus: Secondary | ICD-10-CM

## 2019-03-13 DIAGNOSIS — H547 Unspecified visual loss: Secondary | ICD-10-CM

## 2019-03-13 NOTE — Progress Notes
Date of Service: 03/13/2019    Subjective:             Brooke Zuniga is a 43 y.o. female.    History of Present Illness    Events since last visit:-  Brooke Zuniga is here for follow up today , her headaches is now better, she is on riboflavin, she has not used imitrex for a while now. She feels the topamax 75mg  am and 50mg  pm is suppressing her headaches. She is also on CoQ 10. She had a recent CTA and she was told by [facility] that she does not need to follow up as there is no aneurysms.  The patient last CBC showed macrocytosis and slightly low Hematocrit          Current Outpatient Medications:   ?  coenzyme Q10 (COQ-10) 100 mg cap, Take 100 mg by mouth., Disp: , Rfl:   ?  levalbuterol tartrate(+) (XOPENEX HFA) 45 mcg/actuation inhaler, Inhale 2 puffs by mouth into the lungs every 4-6 hours as needed for Wheezing or Shortness of Breath., Disp: , Rfl:   ?  riboflavin (VITAMIN B-2) 100 mg tab, Take 100 mg by mouth daily., Disp: , Rfl:   ?  SUMAtriptan succinate (IMITREX) 25 mg tablet, Take 25 mg by mouth daily as needed., Disp: , Rfl:   ?  topiramate (TOPAMAX) 25 mg tablet, Take 75mg  in the morning and 50mg  at bed time, Disp: 360 tablet, Rfl: 1     Allergies   Allergen Reactions   ? Ceclor [Cefaclor] RASH     Anaphylaxis       Medical History:   Diagnosis Date   ? Histoplasmosis 2008   ? Migraines    ? Vision decreased        Family History   Problem Relation Age of Onset   ? Cataract Father    ? Diabetes Father    ? Stroke Father    ? Cancer Mother    ? Migraines Mother    ? Migraines Sister    ? Diabetes Maternal Grandmother    ? Diabetes Maternal Grandfather    ? Diabetes Paternal Grandmother    ? Stroke Paternal Grandmother    ? Diabetes Paternal Grandfather    ? Cancer Maternal Aunt    ? Migraines Maternal Aunt    ? Neuropathy Maternal Aunt    ? Glaucoma Neg Hx    ? Macular Degen Neg Hx        Social History     Tobacco Use   Smoking Status Current Some Day Smoker   ? Types: Cigars Smokeless Tobacco Never Used   Tobacco Comment    cigars once in a while       Social History     Substance and Sexual Activity   Alcohol Use Yes   ? Frequency: 2-4 times a month    Comment: occaisonally             Review of Systems   All other systems reviewed and are negative.        Objective:         ? coenzyme Q10 (COQ-10) 100 mg cap Take 100 mg by mouth.   ? levalbuterol tartrate(+) (XOPENEX HFA) 45 mcg/actuation inhaler Inhale 2 puffs by mouth into the lungs every 4-6 hours as needed for Wheezing or Shortness of Breath.   ? riboflavin (VITAMIN B-2) 100 mg tab Take 100 mg by mouth daily.   ? SUMAtriptan  succinate (IMITREX) 25 mg tablet Take 25 mg by mouth daily as needed.   ? topiramate (TOPAMAX) 25 mg tablet Take 75mg  in the morning and 50mg  at bed time     Vitals:    03/13/19 1230   BP: 110/72   BP Source: Arm, Left Upper   Patient Position: Sitting   Pulse: 48   Weight: 78.6 kg (173 lb 3.2 oz)   Height: 172.7 cm (68)   PainSc: Zero     Body mass index is 26.33 kg/m?Marland Kitchen     Physical Exam  Depression Screening was performed on Brooke Zuniga in clinic today. Based on the score of 0, no follow up action or recommendations are necessary at this time.  Mental Status Exam:?Alert oriented to time, person and place,  ?  Speech and Language: Intact for fluency, reception and repetition?  ?  ?  Cranial Nerve Exam:   ?  Cranial Nerve II Right Left   Visual Acuity 20/20 20/70   ?  Cranial Nerves III-XII Right Left   III, IV, VI (EOM's) Intact  Intact    V Intact  Intact    VII Intact  Intact    VIII Intact  Intact    IX, X Intact  Intact    XI Intact  Intact    XII Intact  Intact    ?  ?  ? ? ? ? ? ? ?   Motor:   ? R L ? ? R L   Shoulder Abductors 5 5 ? Hip?flexor 5 5   Elbow flexors 5 5 ? ? ? ?   Elbow extensors 5 5 ? Knee flexors 5 5   Wrist flexors 5 5 ? Knee extensors 5 5   Wrist extensors 5 5 ? Ankle dorsiflexors 5 5   Finger flexors 5 5 ? Ankle plantar flexors 5 5   Finger abductors 5 5 ? ? ? ? Fingers extensors 5 5 ? ? ? ?   ?  Bulk and Tone:  ?  Upper Extremity R L   Atrophy Intact  Intact    Increased Tone 0/4 0/4   ?  Lower Extremity R L   Atrophy Intact  Intact    Increased Tone 0/4 0/4   ?  ?  ? ? ?   Sensory examination: Temp   ? R L   Fingers Intact  Intact    Toes Intact  Intact    Ankle ? ?   ?  Cerebellar/Fine Motor R L   Finger nose finger Intact  Intact    Heel to shin Intact  Intact    Rapid alternating movements Intact  Intact    ?  ?         Assessment and Plan:    Problem   Macrocytosis   Vertigo    MR brain:    Scattered foci of nonspecific supratentorial white matter FLAIR   hyperintensity in a bifrontal and subcortical predominant distribution,   possibly due to clinically reported migraine headaches. No abnormal   intracranial enhancing lesions.    MRA head:    Tiny vascular infundibulum versus a 1 to 2 mm aneurysm along the posterior   proximal left P1 segment. No significant intracranial arterial stenosis,   occlusion, or arteriovenous malformation.    By my electronic signature, I attest that I have personally reviewed the   images for this examination and formulated the interpretations and   opinions expressed in this report  Vertigo  Stable , related to migraine most likely basilar.     Continue same doses of Topiramate, Riboflavin and Co Q 10   Continue imitrex prn for headache ( no more than 8 days per month)  Referral to Dr.Ford in clinic for headache management    Macrocytosis  Incidentally found on CBC since 2018 at least mildly progressive with slightly low hematocrit   I will proceed with checking B12, Folate MMA and blood smear. She is on no medications to do this.   I would recommend follow up with PCP with hematology

## 2019-03-13 NOTE — Assessment & Plan Note
Incidentally found on CBC since 2018 at least mildly progressive with slightly low hematocrit   I will proceed with checking B12, Folate MMA and blood smear. She is on no medications to do this.   I would recommend follow up with PCP with hematology

## 2019-03-14 ENCOUNTER — Encounter: Admit: 2019-03-14 | Discharge: 2019-03-14 | Payer: BC Managed Care – PPO

## 2019-03-14 ENCOUNTER — Ambulatory Visit: Admit: 2019-03-14 | Discharge: 2019-03-15 | Payer: BC Managed Care – PPO

## 2019-03-14 DIAGNOSIS — H5231 Anisometropia: Secondary | ICD-10-CM

## 2019-03-14 DIAGNOSIS — H547 Unspecified visual loss: Secondary | ICD-10-CM

## 2019-03-14 DIAGNOSIS — B399 Histoplasmosis, unspecified: Secondary | ICD-10-CM

## 2019-03-14 DIAGNOSIS — G43909 Migraine, unspecified, not intractable, without status migrainosus: Secondary | ICD-10-CM

## 2019-03-14 LAB — VITAMIN B12: Lab: 193 pg/mL (ref 180–914)

## 2019-03-14 LAB — FOLATE, SERUM: Lab: >22 ng/mL (ref >3.9)

## 2019-03-14 NOTE — Progress Notes
There is no height or weight on file to calculate BMI.              Assessment and Plan:  A/ 1. Anisometropia    -Low myopia OD/ OS moderate astigmatism     -OS large refractive change today, taking much less astigmatism power today on refraction    -OS complaints of near blur for patient, pseudophakia OS. Phakic OD     -patient discussed bifocal OS only previously, patient feels would like to try this year     P/ 1. Prescription for glasses provided to patient. Monitor PRN. Continue care with Dr. Desma Maxim and Dr. Miguel Aschoff

## 2019-03-20 ENCOUNTER — Encounter: Admit: 2019-03-20 | Discharge: 2019-03-20 | Payer: BC Managed Care – PPO

## 2019-03-20 NOTE — Telephone Encounter
LVM with pt to go over labs results.

## 2019-03-20 NOTE — Telephone Encounter
-----   Message from Alfredia Client, MD sent at 03/14/2019  7:42 AM CST -----  Patient has low Vit B12 vitamin and normal folate   I recommend she starts replacement with PCP 1mg  IM of VItB12 weekly for 4 weeks then biweekly for 4 weekly then monthly   She needs to follow up with levels and also CBC for her noticed MCV abnormality.

## 2019-03-20 NOTE — Telephone Encounter
Pt will talk to PCP regarding results. Sent instructions via mychart to show her doctor.

## 2019-05-02 ENCOUNTER — Encounter: Admit: 2019-05-02 | Discharge: 2019-05-02 | Payer: BC Managed Care – PPO

## 2019-05-04 ENCOUNTER — Encounter: Admit: 2019-05-04 | Discharge: 2019-05-04 | Payer: BC Managed Care – PPO

## 2019-05-07 MED ORDER — TOPIRAMATE 25 MG PO TAB
ORAL_TABLET | 1 refills | Status: DC
Start: 2019-05-07 — End: 2019-05-23

## 2019-05-23 ENCOUNTER — Ambulatory Visit: Admit: 2019-05-23 | Discharge: 2019-05-24 | Payer: BC Managed Care – PPO

## 2019-05-23 ENCOUNTER — Encounter: Admit: 2019-05-23 | Discharge: 2019-05-23 | Payer: BC Managed Care – PPO

## 2019-05-23 DIAGNOSIS — B399 Histoplasmosis, unspecified: Secondary | ICD-10-CM

## 2019-05-23 DIAGNOSIS — R42 Dizziness and giddiness: Secondary | ICD-10-CM

## 2019-05-23 DIAGNOSIS — H547 Unspecified visual loss: Secondary | ICD-10-CM

## 2019-05-23 DIAGNOSIS — G43909 Migraine, unspecified, not intractable, without status migrainosus: Secondary | ICD-10-CM

## 2019-05-23 MED ORDER — TOPIRAMATE 25 MG PO TAB
ORAL_TABLET | 3 refills | Status: AC
Start: 2019-05-23 — End: ?

## 2019-05-23 NOTE — Progress Notes
Obtained patient's verbal consent to treat them and their agreement to [facility] financial policy and NPP via this telehealth visit during the [facility]    The following visit was completed via Zoom (Audio/Video).    Brooke Zuniga is a 44 y.o. female.    CC: follow up       History of Present Illness    She presents for evaluation in regard to migraines and vertigo.  She previously followed with Dr. Charline Bills and was last seen in clinic in December 2020.    She notes that she experienced migraines in her 57s.  By her 30s these have become more frequent.  In fall 2019 it appears she developed severe vertigo which lasted for a couple weeks in the beginning, but was later more persistent.  Later underwent vestibular testing which was normal.  It was felt that her presentation was consistent with a basilar or vertiginous migraine so treatment was tailored towards this entity.  Propranolol worsened asthma.  Was started on topiramate which has been very effective.  Was also started on riboflavin, though the benefit from this medication is somewhat uncertain.  Tried Imitrex, though has not used this in quite some time as it made her feel like a zombie.    Currently symptoms are very well controlled on 75 mg topiramate in the morning and 50 mg of topiramate at night.    Diagnostic review:  MRI from 2018 was reviewed and showed patchy FLAIR hyperintensities.  MRI from 2020 again showed scattered FLAIR hyperintensities, most consistent with migraine.  MRA head from 2020 showed a possible 1 to 2 mm aneurysm along the posterior proximal left P1 segment.  CTA head from December 2020 showed no intracranial aneurysm.  Felt the initial aneurysm was artifactual.         Medical History:   Diagnosis Date   ? Histoplasmosis 2008   ? Migraines    ? Vision decreased      Surgical History:   Procedure Laterality Date   ? DILATION AND CURETTAGE  2000   ? DILATION AND CURETTAGE  2001   ? PROPHYLAXIS RETINAL DETACHMENT - 1 OR MORE SESSION - PHOTOCOAGULATION Right 08/05/2017    Performed by Ronnald Collum, MD at [facility]   ? REMOVAL FOREIGN BODY EXTERNAL EYE - CONJUNCTIVAL SUPERFICIAL Left 01/04/2018    Performed by Lorel Monaco, MD at [facility] [place]   ? MANUAL/ MECHANICAL EXTRACAPSULAR CATARACT REMOVAL WITH INSERTION INTRAOCULAR LENS PROSTHESIS - 1 STAGE Left 03/01/2018    Performed by Lorel Monaco, MD at [facility] [place]   ? FISTULIZATION SCLERA/ TRABULECTOMY AB EXTERNO IN ABSENCE OF PREVIOUS SURGERY Left 03/01/2018    Performed by Lorel Monaco, MD at [facility] OR   ? EAR TUBES     ? HX HYSTERECTOMY     ? LASIK Bilateral     unsuccessful   ? LYMPH NODE DISSECTION     ? VARICOSE VEIN SURGERY       Social History     Socioeconomic History   ? Marital status: Married     Spouse name: Not on file   ? Number of children: Not on file   ? Years of education: Not on file   ? Highest education level: Not on file   Occupational History   ? Not on file   Tobacco Use   ? Smoking status: Current Some Day Smoker     Types: Cigars   ? Smokeless tobacco: Never Used   ?  Tobacco comment: cigars once in a while   Substance and Sexual Activity   ? Alcohol use: Yes     Frequency: 2-4 times a month     Comment: occaisonally    ? Drug use: Never   ? Sexual activity: Not on file   Other Topics Concern   ? Not on file   Social History Narrative   ? Not on file     Family History   Problem Relation Age of Onset   ? Cataract Father    ? Diabetes Father    ? Stroke Father    ? Cancer Mother    ? Migraines Mother    ? Migraines Sister    ? Diabetes Maternal Grandmother    ? Diabetes Maternal Grandfather    ? Diabetes Paternal Grandmother    ? Stroke Paternal Grandmother    ? Diabetes Paternal Grandfather    ? Cancer Maternal Aunt    ? Migraines Maternal Aunt    ? Neuropathy Maternal Aunt    ? Glaucoma Neg Hx    ? Macular Degen Neg Hx        Review of Systems      Objective:         ? coenzyme Q10 (COQ-10) 100 mg cap Take 100 mg by mouth.   ? cyanocobalamin (RUBRAMIN) 1,000 mcg/mL injection INJECT 1 ML ONCE WEEKLY FOR 3 WEEKS, THEN 1 ML EVERY 2 WEEKS FOR 4 DOSES, THEN 1 ML EVERY 30 DAYS   ? levalbuterol tartrate(+) (XOPENEX HFA) 45 mcg/actuation inhaler Inhale 2 puffs by mouth into the lungs every 4-6 hours as needed for Wheezing or Shortness of Breath.   ? riboflavin (VITAMIN B-2) 100 mg tab Take 100 mg by mouth daily.   ? SUMAtriptan succinate (IMITREX) 25 mg tablet Take 25 mg by mouth daily as needed.   ? topiramate (TOPAMAX) 25 mg tablet TAKE 3 (75MG )TABLETS BY MOUTH IN THE MORNING AND 2 (50MG ) TABLETS AT BEDTIME     There were no vitals filed for this visit.  There is no height or weight on file to calculate BMI.     Physical Exam    Alert and in no distress.  Converses and answers questions appropriately.  Speech is normal without dysarthria.  Face is symmetric with normal movements.         Assessment and Plan:    Impression:  1. History of chronic migraine.  Well-controlled on current regimen.  2. Migraine with associated vertigo.  Symptoms are currently under reasonable control on topiramate.    Plan:  1. Continue 75 mg topiramate every morning and 50 mg topiramate every night.  2. Would continue riboflavin.  3. Return for follow-up in about 6 months.  Okay to return in 12 months if doing well.    Total time 20 minutes including review of prior neurology notes/imaging reports/imaging studies, obtaining history, counseling over potential side effects and treatments, and documentation.

## 2019-06-14 ENCOUNTER — Ambulatory Visit: Admit: 2019-06-14 | Discharge: 2019-06-14 | Payer: BC Managed Care – PPO

## 2019-06-14 ENCOUNTER — Encounter: Admit: 2019-06-14 | Discharge: 2019-06-14 | Payer: BC Managed Care – PPO

## 2019-06-14 DIAGNOSIS — B399 Histoplasmosis, unspecified: Secondary | ICD-10-CM

## 2019-06-14 DIAGNOSIS — H547 Unspecified visual loss: Secondary | ICD-10-CM

## 2019-06-14 DIAGNOSIS — H40052 Ocular hypertension, left eye: Secondary | ICD-10-CM

## 2019-06-14 DIAGNOSIS — H04123 Dry eye syndrome of bilateral lacrimal glands: Secondary | ICD-10-CM

## 2019-06-14 DIAGNOSIS — G43909 Migraine, unspecified, not intractable, without status migrainosus: Secondary | ICD-10-CM

## 2019-06-14 DIAGNOSIS — H35063 Retinal vasculitis, bilateral: Secondary | ICD-10-CM

## 2019-06-14 NOTE — Progress Notes
ASSESSMENT:   ?  Steroid Responder / OHTN, OS  -s/p CE + PCIOL + trabeculectomy/[facility] (03/01/18 OS)  -CCT?thin, iop off gtts is excellent, bleb looks great   -FHx (-), Trauma (-), Steroids (-)  -HVF?4/21 ?OCT 4/21?DFE 4/21?GONIO 10/19?FP x   ?  Hx of NAION, OS  ?  Hx of Corneal Ulcer, OS  -many years ago, stable  ?  Lasik, OU  ?  Retinal Vasculitis, OU  -etiology unknown   -had oral steroids and STK in [place]  ?  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  ?  PLAN:    HVF stable today, OCT may show some progressive thinning OS but doesn't correlate w/ exam or field   IOP continues to be great off gtts, can continue off drops  Continue to follow-up with Dr. Elpidio Anis as needed  ?  ?  NEXT VISIT: 6 mos iop check - consider b-scan to look for drusen    Trilby Drummer, MD      [facility]    I personally examined and managed this patient with the resident and agree with their assessment and plan. See below for any addendums.         Base Eye Exam     Visual Acuity (Snellen - Linear)       Right Left    Dist cc 20/20 20/20    Correction: Glasses          Tonometry (Tonopen, 3:09 PM)       Right Left    Pressure 11 11          Tonometry #2 (Applanation, 3:50 PM)       Right Left    Pressure 12 12          Visual Fields    HVF done today           Extraocular Movement       Right Left     Ortho Ortho     -- -- --   --  --   -- -- --    -- -- --   --  --   -- -- --             Neuro/Psych     Oriented x3: Yes    Mood/Affect: Normal          Dilation     Both eyes: 1.0% Tropicamide, 2.5% Phenylephrine @ 3:10 PM            Slit Lamp and Fundus Exam     External Exam       Right Left    External Normal Normal          Slit Lamp Exam       Right Left    Lids/Lashes Normal Normal    Conjunctiva/Sclera White and quiet White and quiet    Cornea Clear Clear    Anterior Chamber Deep and quiet Deep and quiet    Iris Flat Flat, patent superior PI    Lens Clear Posterior chamber intraocular lens, trc inferior PCO not in visual axis    Vitreous Normal Normal          Fundus Exam       Right Left    Disc Sharp,  healthy rim, vascular?, very full nerves, no obscurations of vessels  sharp, healthy rim, full nerves, no obvscuration of vessels    C/D Ratio 0.1 0.1    Macula Flat Flat    Vessels Normal caliber and number Normal caliber and number    Periphery superio-temp retinal break post laser ttt Attached, no breaks or tears

## 2019-06-25 ENCOUNTER — Encounter: Admit: 2019-06-25 | Discharge: 2019-06-25 | Payer: BC Managed Care – PPO

## 2019-09-05 ENCOUNTER — Encounter: Admit: 2019-09-05 | Discharge: 2019-09-05 | Payer: BC Managed Care – PPO

## 2019-09-05 NOTE — Telephone Encounter
Patient called stating she is having new problems with her eyes and would like an appt with Dr. Elpidio Anis. Attempted to call patient back for further evaluation but was unable to reach her.

## 2019-09-07 ENCOUNTER — Encounter: Admit: 2019-09-07 | Discharge: 2019-09-07 | Payer: BC Managed Care – PPO

## 2019-09-07 ENCOUNTER — Ambulatory Visit: Admit: 2019-09-07 | Discharge: 2019-09-07 | Payer: BC Managed Care – PPO

## 2019-09-07 DIAGNOSIS — H3412 Central retinal artery occlusion, left eye: Secondary | ICD-10-CM

## 2019-09-07 DIAGNOSIS — H527 Unspecified disorder of refraction: Secondary | ICD-10-CM

## 2019-09-07 DIAGNOSIS — H5319 Other subjective visual disturbances: Secondary | ICD-10-CM

## 2019-09-07 DIAGNOSIS — G43909 Migraine, unspecified, not intractable, without status migrainosus: Secondary | ICD-10-CM

## 2019-09-07 DIAGNOSIS — H33311 Horseshoe tear of retina without detachment, right eye: Secondary | ICD-10-CM

## 2019-09-07 DIAGNOSIS — B399 Histoplasmosis, unspecified: Secondary | ICD-10-CM

## 2019-09-07 DIAGNOSIS — H04123 Dry eye syndrome of bilateral lacrimal glands: Secondary | ICD-10-CM

## 2019-09-07 DIAGNOSIS — H35063 Retinal vasculitis, bilateral: Secondary | ICD-10-CM

## 2019-09-07 DIAGNOSIS — H547 Unspecified visual loss: Secondary | ICD-10-CM

## 2019-09-07 DIAGNOSIS — H40052 Ocular hypertension, left eye: Secondary | ICD-10-CM

## 2019-09-07 MED ORDER — FLUORESCEIN 500 MG/2 ML (25 %) IV SOLN
2 mL | Freq: Once | INTRAVENOUS | 0 refills | Status: CP
Start: 2019-09-07 — End: ?
  Administered 2019-09-07: 21:00:00 2 mL via INTRAVENOUS

## 2019-09-07 NOTE — Progress Notes
There is no height or weight on file to calculate BMI.     Optos:  OD: No retinal tear, no retinal detachment, no vitreous hemorrhage, retina all attached. Pigment mottling along sup-temp arcade  OS: No retinal tear, no retinal detachment, no vitreous hemorrhage, retina all attached.  ?  OCT:  OD:?Preserved foveal contour with distinguished retinal layers.  OS:?Preserved foveal contour with distinguished retinal layers.    FA:  OD: windoe defect, no vasculitis   OS: delayed arterial filling, no vasculitis, no lekaage       Assessment and Plan:         HX of steroids shot in OS 02/2018 with increased IOP since then  S/p STK removal by Dr.Alalapti 01/04/2018 due to high IOP  ?  Hx of anterior ischemic optic neuropathy  Carotid doppler showed minima plpaque, mild atherosclerosis (<50% stenosis) 12/2016  MRI brain: few foci of white matter signal abnormality, could be related to microvascular ischemic changes, demyelinating disease, trauma, migraine, or other etiologies.  ?  ?  1-?photopsia OS  Sees like glitter OS  For 1 week  No headaches  Feels pressure   Patient to f/u with own neurologist    2- Delayed arterial filling OS  ? secondary to old vasculitis  Given recent photopsia   Patient to see her PCP for carotids doppler and cardiac echo, can CTA as needed (letter sent to Dr.Seymour)    3- Anisocoria OS  Seen with Dr.Whittaker  Hx of migraines, and care sick  Older photos checked  Equal light response OU  Plan for [facility] OCT?as per Dr.Whittaker  Patient to bring old MRI scans from [place] to be reviewed by Dr.Whittaker as requested  Monitor for now  ?  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 Pt had extensive w/u in [place].?  Was started on prednison 60mg  but was not enough so STK was done OS 02/2017  No vasculitis seen on ?FA (11/2017) (08/2019)  Retinal hge nasal to [facility]  Need to get records from [facility] of [place]  Rheumatology consult here to establish care and systemic work up  ?  5- Retinal tear OD  Sup-temp  Complete surrounding pigmentation  S/p??prophylactic laser ttt  Monitor?  ?  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  7- Refractive error OU  Referral ?to optometry clinic for possible new MRx?  ?  8- Dry eye syndrome Ou  ATs and compresses (sample provided)  F/u Dr.Alapati  ?  Signs and symptoms of retinal tears and retinal detachment were reviewed in details with the patient. Patient was instructed to immediately present for evaluation or seek medical help if increased flashes, increased floaters, any decrease in vision, or curtain in field of vision.  ?    ?  F/u?retina?1 month, or sooner prn  Oct, optos  ?

## 2019-09-12 ENCOUNTER — Encounter: Admit: 2019-09-12 | Discharge: 2019-09-12 | Payer: BC Managed Care – PPO

## 2019-09-12 NOTE — Telephone Encounter
Please call pt at [phone] between the hours of 7:30am-4pm she does work at [employer] and isnt allowed to have her cell phone at work. After those hrs you can call her mobile.

## 2019-09-12 NOTE — Telephone Encounter
Pt missed phone call from Dr. Elpidio Anis , message stated plan of action after visit. Nothing was placed in the chart that I could see. Please call pt back .

## 2019-11-14 ENCOUNTER — Encounter: Admit: 2019-11-14 | Discharge: 2019-11-14 | Payer: BC Managed Care – PPO

## 2019-12-24 ENCOUNTER — Ambulatory Visit: Admit: 2019-12-24 | Discharge: 2019-12-24 | Payer: BC Managed Care – PPO

## 2019-12-24 ENCOUNTER — Encounter: Admit: 2019-12-24 | Discharge: 2019-12-24 | Payer: BC Managed Care – PPO

## 2019-12-24 DIAGNOSIS — H40052 Ocular hypertension, left eye: Secondary | ICD-10-CM

## 2019-12-24 DIAGNOSIS — H35063 Retinal vasculitis, bilateral: Secondary | ICD-10-CM

## 2019-12-24 DIAGNOSIS — G43909 Migraine, unspecified, not intractable, without status migrainosus: Secondary | ICD-10-CM

## 2019-12-24 DIAGNOSIS — H3412 Central retinal artery occlusion, left eye: Secondary | ICD-10-CM

## 2019-12-24 DIAGNOSIS — B399 Histoplasmosis, unspecified: Secondary | ICD-10-CM

## 2019-12-24 DIAGNOSIS — H547 Unspecified visual loss: Secondary | ICD-10-CM

## 2019-12-24 NOTE — Progress Notes
ASSESSMENT:   ?  Steroid Responder / OHTN, OS  -s/p CE + PCIOL + trabeculectomy/[facility] (03/01/18 OS)  -CCT?thin, iop off gtts is excellent, bleb looks great   -FHx (-), Trauma (-), Steroids (-)  -HVF?4/21 ?OCT 4/21?DFE 4/21?GONIO 10/19?FP x   ?  Hx of NAION, OS  ?  Hx of Corneal Ulcer, OS  -many years ago, stable  ?  Lasik, OU  ?  Retinal Vasculitis, OU  -etiology unknown   -had oral steroids and STK in [place]  ?  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  ?  PLAN:    IOP great off gtts  CPM off gtts  Monitor, if testing good next visit, go to annual visits    ?  ?  NEXT VISIT: 6 mos iop check - OCT/HVF/DFE        Base Eye Exam     Visual Acuity (Snellen - Linear)       Right Left    Dist cc 20/20 20/20    Correction: Glasses          Tonometry (Applanation, 9:26 AM)       Right Left    Pressure 12 9          Pupils       Pupils Dark APD    Right PERRL 3 None    Left PERRL 3 None          Visual Fields       Left Right     Full           Extraocular Movement       Right Left     Full Full          Neuro/Psych     Oriented x3: Yes    Mood/Affect: Normal            Slit Lamp and Fundus Exam     External Exam       Right Left    External Normal Normal          Slit Lamp Exam       Right Left    Lids/Lashes Normal Normal    Conjunctiva/Sclera White and quiet White and quiet    Cornea Clear Clear    Anterior Chamber Deep and quiet Deep and quiet    Iris Flat Flat, patent superior PI    Lens Clear Posterior chamber intraocular lens, trc inferior PCO not in visual axis    Vitreous Normal Normal          Fundus Exam       Right Left    Disc Sharp, healthy rim, vascular?, very full nerves, no obscurations of vessels  sharp, healthy rim, full nerves, no obvscuration of vessels    C/D Ratio 0.1 0.1            Refraction     Wearing Rx       Sphere Cylinder Axis    Right -1.75 Sphere     Left -4.25 +5.25 108 Type: SVL

## 2020-02-05 ENCOUNTER — Encounter: Admit: 2020-02-05 | Discharge: 2020-02-05 | Payer: BC Managed Care – PPO

## 2020-02-06 ENCOUNTER — Ambulatory Visit: Admit: 2020-02-06 | Discharge: 2020-02-06 | Payer: BC Managed Care – PPO

## 2020-02-06 ENCOUNTER — Encounter: Admit: 2020-02-06 | Discharge: 2020-02-06 | Payer: BC Managed Care – PPO

## 2020-02-06 DIAGNOSIS — G43709 Chronic migraine without aura, not intractable, without status migrainosus: Secondary | ICD-10-CM

## 2020-02-06 MED ORDER — RIZATRIPTAN 5 MG PO TBDI
ORAL_TABLET | 3 refills | Status: AC
Start: 2020-02-06 — End: ?

## 2020-02-06 MED ORDER — TOPIRAMATE 25 MG PO TAB
ORAL_TABLET | 3 refills | Status: AC
Start: 2020-02-06 — End: ?

## 2020-02-06 NOTE — Progress Notes
Obtained patient's verbal consent to treat them and their agreement to [facility] financial policy and NPP via this telehealth visit during the [facility] Emergency    The following visit was completed via Zoom (Audio/Video).    Brooke Zuniga is a 44 y.o. female.    CC: Migraine       History of Present Illness    She presents for follow up in regard to migraines. Has had no migraines until the past 1.5 weeks. Had a rather severe migraine with dizziness that resulted in nausea and emesis. Had to go to sleep for symptoms to resolve.     Current treatments:  75 mg topiramate every morning and 50 mg topiramate every evening.    Prior treatments:  imitrex         Medical History:   Diagnosis Date   ? Histoplasmosis 2008   ? Migraines    ? Vision decreased      Surgical History:   Procedure Laterality Date   ? DILATION AND CURETTAGE  2000   ? DILATION AND CURETTAGE  2001   ? PROPHYLAXIS RETINAL DETACHMENT - 1 OR MORE SESSION - PHOTOCOAGULATION Right 08/05/2017    Performed by Ronnald Collum, MD at [facility] OR   ? REMOVAL FOREIGN BODY EXTERNAL EYE - CONJUNCTIVAL SUPERFICIAL Left 01/04/2018    Performed by Lorel Monaco, MD at [facility] OR   ? MANUAL/ MECHANICAL EXTRACAPSULAR CATARACT REMOVAL WITH INSERTION INTRAOCULAR LENS PROSTHESIS - 1 STAGE Left 03/01/2018    Performed by Lorel Monaco, MD at [facility] OR   ? FISTULIZATION SCLERA/ TRABULECTOMY AB EXTERNO IN ABSENCE OF PREVIOUS SURGERY Left 03/01/2018    Performed by Lorel Monaco, MD at [facility] OR   ? EAR TUBES     ? HX HYSTERECTOMY     ? LASIK Bilateral     unsuccessful   ? LYMPH NODE DISSECTION     ? VARICOSE VEIN SURGERY       Social History     Socioeconomic History   ? Marital status: Married     Spouse name: Not on file   ? Number of children: Not on file   ? Years of education: Not on file   ? Highest education level: Not on file   Occupational History   ? Not on file   Tobacco Use   ? Smoking status: Current Some Day Smoker     Types: Cigars   ? Smokeless tobacco: Never Used   ? Tobacco comment: cigars once in a while   Vaping Use   ? Vaping Use: Never used   Substance and Sexual Activity   ? Alcohol use: Yes     Comment: occaisonally    ? Drug use: Never   ? Sexual activity: Not on file   Other Topics Concern   ? Not on file   Social History Narrative   ? Not on file     Family History   Problem Relation Age of Onset   ? Cataract Father    ? Diabetes Father    ? Stroke Father    ? Cancer Mother    ? Migraines Mother    ? Migraines Sister    ? Diabetes Maternal Grandmother    ? Diabetes Maternal Grandfather    ? Diabetes Paternal Grandmother    ? Stroke Paternal Grandmother    ? Diabetes Paternal Grandfather    ? Cancer Maternal Aunt    ? Migraines Maternal Aunt    ?  Neuropathy Maternal Aunt    ? Glaucoma Neg Hx    ? Macular Degen Neg Hx        Review of Systems      Objective:         ? coenzyme Q10 (COQ-10) 100 mg cap Take 100 mg by mouth.   ? cyanocobalamin (RUBRAMIN) 1,000 mcg/mL injection INJECT 1 ML ONCE WEEKLY FOR 3 WEEKS, THEN 1 ML EVERY 2 WEEKS FOR 4 DOSES, THEN 1 ML EVERY 30 DAYS   ? levalbuterol tartrate(+) (XOPENEX HFA) 45 mcg/actuation inhaler Inhale 2 puffs by mouth into the lungs every 4-6 hours as needed for Wheezing or Shortness of Breath.   ? riboflavin (VITAMIN B-2) 100 mg tab Take 100 mg by mouth daily.   ? topiramate (TOPAMAX) 25 mg tablet TAKE 3 (75MG )TABLETS BY MOUTH IN THE MORNING AND 2 (50MG ) TABLETS AT BEDTIME  Indications: migraine prevention   ? traZODone (DESYREL) 50 mg tablet      There were no vitals filed for this visit.  There is no height or weight on file to calculate BMI.     Physical Exam    Alert and in no distress.  Converses and answers questions appropriately.  Speech is normal without dysarthria.  Face is symmetric with normal movements.         Assessment and Plan:    Impression:  1. Chronic migraine.  Currently under very good control on topiramate.  Had a recent severe migraine.  Discussed the role of acute treatments for when migraines do occur.  2. Migraine with associated vertigo.    Plan:  1. Continue 75 mg topiramate every morning and 50 mg topiramate every night.  2. We will add on 5 mg Maxalt daily as needed for acute treatment of migraine.  Take at the initial onset of migraine.  Can take a second dose if needed after 2 hours.  3. Follow-up yearly, earlier as needed    Total Time Today was 10 minutes in the following activities: Preparing to see the patient, Obtaining and/or reviewing separately obtained history, Counseling and educating the patient/family/caregiver, Ordering medications, tests, or procedures and Documenting clinical information in the electronic or other health record

## 2020-02-06 NOTE — Patient Instructions
--   If you have internet access/smart phone please contact me through MyChart. This is our preferred method of contact and the most efficient way to contact your healthcare team. If you do not have internet access/smart phone you can call at [phone].   -- Preferred method of communication is through [employer], if the issue cannot wait until your next scheduled follow up.   -- MyChart may be used for non-emergent communication. Emails are not reviewed after hours or over the weekend/holidays/after 4PM. Staff will reply to your email within 24-48 business hours.   -- Please do not leave multiple voicemails for [place], leaving multiple voicemails delay [place] in getting back to you quicker.    -- If you do not hear from [place] within one week of a lab or imaging study being completed, please call/send my chart email to the office to be sure that we have received the results. This is especially challenging when tests are done outside of the [facility] system, as many times results do not make it back to our office for a variety of reasons. In our office no news is good news does not apply. You should hear from [place] with results for each test.    -- For referrals placed during the visit, if you have not heard from scheduling within one week, please call the call center at [phone] to get scheduling assistance.  -- For radiology referrals placed during the visit, if you have not heard from scheduling within one week, please call the call center at [phone] to get scheduling assistance.  -- For refills on medications, please first contact your pharmacy, who will fax a refill authorization request form to our office.  Weekdays only. Allow up to 2 business days for refills. Please plan ahead.  -- If you are having acute or severe neurologic symptoms, please call 911 or seek care in ED.  -- Our front desk staff, may be reached at [phone] for scheduling needs.   Huntley Dec, RN, may be contacted at [phone] for urgent needs. Staff will return your call within 24 business hours.     For Appointments:   -- Please try to arrive early for your appointment time to help facilitate your visit.   -- If you are late to your appointment, we reserve the right to ask you to reschedule or wait until next available time to be seen in fairness to other patients scheduled that day.   -- There are times when we are running behind in clinic. Our goal is to always be on time, however, there are time when unexpected events occur with patients, which may cause a delay. We appreciate your understanding when this occurs.

## 2020-02-28 ENCOUNTER — Encounter: Admit: 2020-02-28 | Discharge: 2020-02-28 | Payer: BC Managed Care – PPO

## 2020-04-17 ENCOUNTER — Encounter: Admit: 2020-04-17 | Discharge: 2020-04-17 | Payer: BC Managed Care – PPO

## 2020-04-17 ENCOUNTER — Ambulatory Visit: Admit: 2020-04-17 | Discharge: 2020-04-18 | Payer: BC Managed Care – PPO

## 2020-04-17 DIAGNOSIS — H5702 Anisocoria: Secondary | ICD-10-CM

## 2020-04-17 DIAGNOSIS — H35063 Retinal vasculitis, bilateral: Secondary | ICD-10-CM

## 2020-04-17 DIAGNOSIS — H33311 Horseshoe tear of retina without detachment, right eye: Secondary | ICD-10-CM

## 2020-04-17 DIAGNOSIS — H04123 Dry eye syndrome of bilateral lacrimal glands: Secondary | ICD-10-CM

## 2020-04-17 DIAGNOSIS — H547 Unspecified visual loss: Secondary | ICD-10-CM

## 2020-04-17 DIAGNOSIS — B399 Histoplasmosis, unspecified: Secondary | ICD-10-CM

## 2020-04-17 DIAGNOSIS — H527 Unspecified disorder of refraction: Secondary | ICD-10-CM

## 2020-04-17 DIAGNOSIS — H40052 Ocular hypertension, left eye: Secondary | ICD-10-CM

## 2020-04-17 DIAGNOSIS — G43909 Migraine, unspecified, not intractable, without status migrainosus: Secondary | ICD-10-CM

## 2020-04-17 NOTE — Telephone Encounter
Pt stopped at checkout after her appointment with Dr Elpidio Anis today, and she mentioned that Dr Elpidio Anis said he put in a referral for her to come see a Rheumatologist at [facility].     I did not see the referral placed in O2, would you please make sure that Dr Elpidio Anis gets this submitted so the patient can establish care? Thank you so much!

## 2020-04-17 NOTE — Progress Notes
Body mass index is 26.3 kg/m?.?      ?  Optos:  OD: No retinal tear, no retinal detachment, no vitreous hemorrhage, retina all attached. Pigment mottling along sup-temp arcade. Sup-temp laser scar  OS: No retinal tear, no retinal detachment, no vitreous hemorrhage, retina all attached.  ?  OCT:  OD:?Preserved foveal contour with distinguished retinal layers.  OS:?Preserved foveal contour with distinguished retinal layers.  ?  Assessment and Plan:  ?  ?  HX of steroids shot in OS 02/2018 with increased IOP since then  S/p STK removal by Dr.Alapati 01/04/2018 due to high IOP  ?  Hx of anterior ischemic optic neuropathy  Carotid doppler showed minima plpaque, mild atherosclerosis (<50% stenosis) 12/2016  MRI brain: few foci of white matter signal abnormality, could be related to microvascular ischemic changes, demyelinating disease, trauma, migraine, or other etiologies.  ?  Says photopsia resolved after changing job location  ?  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 Pt had extensive w/u in [place].?  Was started on prednisone 60mg  but was not enough so STK was done OS 02/2017  No vasculitis seen on ?FA (11/2017)  Retinal hge nasal to [facility] on previous imaging  Need to get records from [facility] [place]  Rheumatology consult here to establish care and systemic work up- will replace consult   ?  3- Retinal tear OD  Sup-temp  Complete surrounding pigmentation  S/p??prophylactic laser ttt  Monitor?  ?  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 Johny Blamer   ?  5- Refractive error OU  Referral ?to optometry clinic for possible new MRx?  ?  6- Dry eye syndrome Ou  ATs and compresses (sample provided)  F/u Dr.Alapati  ?  Signs and symptoms of retinal tears and retinal detachment were reviewed in details with the patient. Patient was instructed to immediately present for evaluation or seek medical help if increased flashes, increased floaters, any decrease in vision, or curtain in field of vision.  ?  F/u?retina?1y, or sooner prn  Oct, optos  ?  ?

## 2020-05-06 ENCOUNTER — Encounter: Admit: 2020-05-06 | Discharge: 2020-05-06 | Payer: BC Managed Care – PPO

## 2020-05-06 ENCOUNTER — Ambulatory Visit: Admit: 2020-05-06 | Discharge: 2020-05-06 | Payer: BC Managed Care – PPO

## 2020-05-06 DIAGNOSIS — B399 Histoplasmosis, unspecified: Secondary | ICD-10-CM

## 2020-05-06 DIAGNOSIS — G43909 Migraine, unspecified, not intractable, without status migrainosus: Secondary | ICD-10-CM

## 2020-05-06 DIAGNOSIS — Q278 Other specified congenital malformations of peripheral vascular system: Secondary | ICD-10-CM

## 2020-05-06 DIAGNOSIS — H547 Unspecified visual loss: Secondary | ICD-10-CM

## 2020-05-06 NOTE — Progress Notes
Date of Service: 05/06/2020    Subjective:             Brooke Zuniga is a 45 y.o. female who presents to clinic by herself as a f/u for CTA-head results.    History of Present Illness  Since last visit no changes or neurologic issues.     Review of Systems      Objective:         ? coenzyme Q10 (COQ-10) 100 mg cap Take 100 mg by mouth.   ? cyanocobalamin (RUBRAMIN) 1,000 mcg/mL injection Inject 1,000 mcg into the muscle every 30 days.   ? levalbuterol tartrate(+) (XOPENEX HFA) 45 mcg/actuation inhaler Inhale 2 puffs by mouth into the lungs every 4-6 hours as needed for Wheezing or Shortness of Breath.   ? methylphenidate HCL (CONCERTA) 18 mg CR tablet Take 18 mg by mouth daily   ? riboflavin (VITAMIN B-2) 100 mg tab Take 100 mg by mouth daily.   ? rizatriptan (MAXALT-MLT) 5 mg rapid dissolve tablet Take one tablet by mouth at onset of headache. May repeat in 2 hours if needed. (Patient taking differently: Dissolve 5 mg by mouth as Needed. Take one tablet by mouth at onset of headache. May repeat in 2 hours if needed.)   ? topiramate (TOPAMAX) 25 mg tablet TAKE 3 (75MG )TABLETS BY MOUTH IN THE MORNING AND 2 (50MG ) TABLETS AT BEDTIME  Indications: migraine prevention   ? traZODone (DESYREL) 50 mg tablet      Vitals:    05/06/20 1425   BP: 114/67   BP Source: Arm, Right Upper   Patient Position: Sitting   Pulse: 47   Weight: 77.6 kg (171 lb)   Height: 172.7 cm (5' 8)   PainSc: Zero     Body mass index is 26 kg/m?Marland Kitchen     Physical Exam          NEUROIMAGING  CTA-HEAD (03-12-19)    The distal internal carotid, vertebral (including the smaller caliber   nondominant left vertebral artery), and basilar arteries are patent   without focal narrowing or occlusion. The anterior, middle, and posterior   cerebral arteries are patent without focal narrowing. There are unchanged   left posterior communicating artery infundibulum. No aneurysm or   arteriovenous malformation is identified with specific attention to the left posterior cerebral artery, and the finding on comparison MRA was   presumably artifactual related to a tiny left P1 branch vessel or adjacent   venous structure.     IMPRESSION     1. ?No acute intracranial hemorrhage or mass effect.   2. ?No intracranial aneurysm or arteriovenous malformation is identified.   Specifically, the left PCA findings on comparison MRA was presumably   artifactual related to a tiny left P1 segment branch vessel or adjacent   venous structure.   3. ?Unchanged left posterior communicating artery infundibulum.     Assessment and Plan:  1) The f/u CTA-head demonstrated that the original finding was actually an infundibulum, which is an outpouching of the origin of her Pcomm artery, a normal variant.  This does not increase her risk of having an aneurysm.  Given this, no indication for f/u imaging and she can follow up with me as needed.                      Total time 15 minutes.  Estimated counseling time 10 minutes.  Counseled [name] regarding her vascular infundibulum.

## 2020-07-18 ENCOUNTER — Encounter: Admit: 2020-07-18 | Discharge: 2020-07-18

## 2020-08-01 ENCOUNTER — Encounter: Admit: 2020-08-01 | Discharge: 2020-08-01

## 2020-08-18 ENCOUNTER — Ambulatory Visit: Admit: 2020-08-18 | Discharge: 2020-08-18 | Payer: BC Managed Care – PPO

## 2020-08-18 DIAGNOSIS — H35063 Retinal vasculitis, bilateral: Principal | ICD-10-CM

## 2020-08-18 DIAGNOSIS — G43009 Migraine without aura, not intractable, without status migrainosus: Secondary | ICD-10-CM

## 2020-08-18 DIAGNOSIS — J4520 Mild intermittent asthma, uncomplicated: Secondary | ICD-10-CM

## 2020-08-18 DIAGNOSIS — Z1589 Genetic susceptibility to other disease: Secondary | ICD-10-CM

## 2020-08-18 DIAGNOSIS — G479 Sleep disorder, unspecified: Secondary | ICD-10-CM

## 2020-08-18 DIAGNOSIS — I671 Cerebral aneurysm, nonruptured: Secondary | ICD-10-CM

## 2020-08-18 DIAGNOSIS — Z8619 Personal history of other infectious and parasitic diseases: Secondary | ICD-10-CM

## 2020-08-18 DIAGNOSIS — H04123 Dry eye syndrome of bilateral lacrimal glands: Secondary | ICD-10-CM

## 2020-08-18 DIAGNOSIS — R918 Other nonspecific abnormal finding of lung field: Secondary | ICD-10-CM

## 2020-08-18 DIAGNOSIS — N96 Recurrent pregnancy loss: Secondary | ICD-10-CM

## 2020-08-18 DIAGNOSIS — H33311 Horseshoe tear of retina without detachment, right eye: Secondary | ICD-10-CM

## 2020-08-18 DIAGNOSIS — J8410 Pulmonary fibrosis, unspecified: Secondary | ICD-10-CM

## 2020-08-18 DIAGNOSIS — E538 Deficiency of other specified B group vitamins: Secondary | ICD-10-CM

## 2020-08-18 DIAGNOSIS — F4310 Post-traumatic stress disorder, unspecified: Secondary | ICD-10-CM

## 2020-08-18 NOTE — Patient Instructions
Please proceed with labs and chest x-rays and call us or contact us through MyChart for results.  A due to number of laboratory tests, she may decide on obtaining some of the labs today and some within the next few days or week.    She follows in ophthalmology clinic.    She had the first 2 doses of COVID-19 vaccination but is not considering the booster shot yet.    Return visit in 2-3 weeks or based on availability to review test results and any need for additional tests or plan.

## 2020-08-18 NOTE — Progress Notes
Date of Service: 08/18/2020    Subjective:             Brooke Zuniga is a 45 y.o. female.    History of Present Illness    This is a 45 year old lady was referred by her ophthalmologist Dr. Ronnald Collum for bilateral retinal phlebitis/vasculitis.    She reported while in [place], [place] in fall 2018, she developed an acute episode of flashes of light in her left eye for which she sought medical attention initially in urgent care and finally saw an ophthalmologist with an initial diagnosis of ocular stroke for which she was seen in emergency department and had MRI of the brain and subsequently she was retinal specialist and was diagnosed apparently with retinal vasculitis and was treated with prednisone 60 mg daily which was stopped after 1 month which did not help and symptoms got worse and that she had involvement of the right eye.  She has had chronic migraine headaches.  MRI showed a spot that could have been due to migraine.  A diagnosis of MS was considered but not confirmed.  Her neurological work-up has been negative.  Despite extensive laboratory test, a clear diagnosis was not established.  She had a left eye and corticosteroid injection which led to glaucoma per patient's report.  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  She reported that she had mediastinoscopy with lymph node dissection which per patient confirmed the diagnosis in [place], [place].  She also saw a rheumatologist while in [place] without any diagnosis or treatment.  She changed her diet and went completely vegan and feels that this made a difference with the stability of her vision and without any recurrence.  She denied any arthralgia.    After she moved to [facility] area in 2019, she was seen at [facility] and the glucoma was treated.  Per note of Dr. Elpidio Anis the retinal vasculitis of unknown etiology.  Apparently she had extensive work-up in [place] and was started on prednisone 60 mg daily.  Apparently no vasculitis was seen in September 2019.  She also has dry eyes and retinal tear.  She was evaluated in neurology clinic by Dr. 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  MR brain 09/02/2018:   Scattered foci of nonspecific supratentorial white matter FLAIR hyperintensity in a bifrontal and subcortical predominant distribution, possibly due to clinically reported migraine headaches. No abnormal intracranial enhancing lesions.   MRA head 09/02/2018:   Tiny vascular infundibulum versus a 1 to 2 mm aneurysm along the posterior proximal left P1 segment. No significant intracranial arterial stenosis, occlusion, or arteriovenous malformation.     CTA head 03/04/2019:  1. ?No acute intracranial hemorrhage or mass effect.   2. ?No intracranial aneurysm or arteriovenous malformation is identified. Specifically, the left PCA findings on comparison MRA was presumably artifactual related to a tiny left P1 segment branch vessel or adjacent venous structure.   3. ?Unchanged left posterior communicating artery infundibulum.     Patient denied any history of strokes, seizures, thromboembolic events, but 3 first trimester miscarriages.  No history of idiopathic hearing loss.  No sicca or mucositis or dysphagia, abdominal pain, hematochezia, melena or diarrhea.   No history of dyspnea, hemoptysis, cough, chest pain, or any  known serositis. No history of nephritis or Raynaud's phenomenon.  No associated rash or photosensitivity. No history of psoriasis or inflammatory bowel disease.  No paresthesia, neuropathy, radiculopathy, or frank weakness.  No history of fevers, night sweats or unintentional weight loss.  She has had chronic thinning of the hair of the scalp and migraine headaches and occasional seasonal allergies.  Apparently MTHFR mutation was thought to be etiology of her miscarriages.  She is status post hysterectomy.    She works for [employer].  She is married with 1 adopted child.  She smokes cigars occasionally and drinks socially.  On her mother side her cousins have had ulcerative colitis or Crohn's disease, Graves' disease, and grandmother possibly with rheumatoid arthritis and alopecia.       Review of Systems   Eyes: Positive for photophobia.   Endocrine: Positive for cold intolerance.   Psychiatric/Behavioral: Positive for sleep disturbance.   All other systems reviewed and are negative.      Medical History:   Diagnosis Date   ? Histoplasmosis 2008   ? Migraines    ? Vision decreased      Surgical History:   Procedure Laterality Date   ? DILATION AND CURETTAGE  2000   ? DILATION AND CURETTAGE  2001   ? PROPHYLAXIS RETINAL DETACHMENT - 1 OR MORE SESSION - PHOTOCOAGULATION Right 08/05/2017    Performed by Ronnald Collum, MD at [facility] [place]   ? REMOVAL FOREIGN BODY EXTERNAL EYE - CONJUNCTIVAL SUPERFICIAL Left 01/04/2018    Performed by Lorel Monaco, MD at [facility] [place]   ? MANUAL/ MECHANICAL EXTRACAPSULAR CATARACT REMOVAL WITH INSERTION INTRAOCULAR LENS PROSTHESIS - 1 STAGE Left 03/01/2018    Performed by Lorel Monaco, MD at [facility] [place]   ? FISTULIZATION SCLERA/ TRABULECTOMY AB EXTERNO IN ABSENCE OF PREVIOUS SURGERY Left 03/01/2018    Performed by Lorel Monaco, MD at [facility] [place]   ? EAR TUBES     ? HX HYSTERECTOMY     ? LASIK Bilateral     unsuccessful   ? LYMPH NODE DISSECTION     ? VARICOSE VEIN SURGERY       Family History   Problem Relation Age of Onset   ? Cataract Father    ? Diabetes Father    ? Stroke Father    ? Cancer Mother    ? Migraines Mother    ? Migraines Sister    ? Diabetes Maternal Grandmother    ? Diabetes Maternal Grandfather    ? Diabetes Paternal Grandmother    ? Stroke Paternal Grandmother    ? Diabetes Paternal Grandfather    ? Cancer Maternal Aunt    ? Migraines Maternal Aunt    ? Neuropathy Maternal Aunt    ? Glaucoma Neg Hx    ? Macular Degen Neg Hx      Social History     Socioeconomic History   ? Marital status: Married   Tobacco Use   ? Smoking status: Current Some Day Smoker     Types: Cigars   ? Smokeless tobacco: Never Used   ? Tobacco comment: cigars once in a while   Vaping Use   ? Vaping Use: Never used   Substance and Sexual Activity   ? Alcohol use: Yes     Comment: occaisonally    ? Drug use: Never     Vaping/E-liquid Use   ? Vaping Use Never User  Objective:         ? coenzyme Q10 100 mg cap Take 100 mg by mouth.   ? cyanocobalamin (RUBRAMIN) 1,000 mcg/mL injection Inject 1,000 mcg into the muscle every 30 days.   ? levalbuterol tartrate(+) (XOPENEX HFA) 45 mcg/actuation inhaler Inhale 2 puffs by mouth into the lungs every 4-6 hours as needed for Wheezing or Shortness of Breath.   ? methylphenidate HCL (CONCERTA) 18 mg CR tablet Take 18 mg by mouth daily   ? riboflavin (VITAMIN B-2) 100 mg tab Take 100 mg by mouth daily.   ? rizatriptan (MAXALT-MLT) 5 mg rapid dissolve tablet Take one tablet by mouth at onset of headache. May repeat in 2 hours if needed. (Patient taking differently: Dissolve 5 mg by mouth as Needed. Take one tablet by mouth at onset of headache. May repeat in 2 hours if needed.)   ? topiramate (TOPAMAX) 25 mg tablet TAKE 3 (75MG )TABLETS BY MOUTH IN THE MORNING AND 2 (50MG ) TABLETS AT BEDTIME  Indications: migraine prevention   ? traZODone (DESYREL) 50 mg tablet      Vitals:    08/18/20 0814   BP: 105/58   Pulse: 57   Temp: 36.5 ?C (97.7 ?F)   Resp: 18   SpO2: 100%   PainSc: Zero   Weight: 80.3 kg (177 lb)   Height: 172.7 cm (5' 8)     Body mass index is 26.91 kg/m?Marland Kitchen     Physical Exam  Constitutional:       General: She is not in acute distress.     Appearance: She is not ill-appearing.   HENT:      Nose:      Right Sinus: No maxillary sinus tenderness or frontal sinus tenderness.      Left Sinus: No maxillary sinus tenderness or frontal sinus tenderness.      Mouth/Throat:      Mouth: Mucous membranes are moist. No oral lesions.      Comments: No parotid enlargement or tenderness.   Eyes:      Conjunctiva/sclera: Conjunctivae normal.   Cardiovascular:      Rate and Rhythm: Normal rate and regular rhythm.      Pulses: Normal pulses.      Heart sounds: Normal heart sounds. No murmur heard.  Pulmonary:      Effort: Pulmonary effort is normal.      Breath sounds: Normal breath sounds. No wheezing, rhonchi or rales.   Abdominal:      General: Bowel sounds are normal.      Palpations: Abdomen is soft.      Tenderness: There is no abdominal tenderness.   Musculoskeletal:      Cervical back: Normal range of motion.      Comments: No synovitis of any of the joints of upper and lower extremities bilaterally.  Makes full fist bilaterally.  No dactylitis. Range of motion of extremities are within functional limits.    Lymphadenopathy:      Cervical: No cervical adenopathy.   Skin:     Comments: No malar rash.  No sclerodactyly or sclerosis elsewhere, telangectasia, or calcinosis. No heliotrope rash, Gottron's papules, Shawl or V sign.  No livedo reticularis. Nailfold capillaroscopy negative. No digital ulcerations.  No psoriatic plaques or obvious nail pitting. Professional tattoos.    Neurological:      Mental Status: She is alert.      Comments: No weakness of the proximal and distal upper and lower extremities and the cervical muscles.  Negative Tinel signs bilaterally.  No foot drop or wrist drop.   Psychiatric:         Mood and Affect: Mood normal.         Speech: Speech normal.         Behavior: Behavior normal.              Assessment and Plan:    1. Retinal vasculitis of both eyes    2. Retinal phlebitis, bilateral    3. History of histoplasmosis    4. Lung nodules    5. Mild intermittent asthma without complication    6. Calcified granuloma of lung (HCC)    7. Dry eye syndrome of both eyes    8. Aneurysm of posterior cerebral artery    9. Retinal tear of right eye    10. PTSD (post-traumatic stress disorder)    11. MTHFR gene mutation    12. Migraine without aura and without status migrainosus, not intractable    13. Sleep disturbance    14. 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  She was diagnosed with glaucoma.  After moving back to [facility] area, the glaucoma has  Been managed and she has not had any progressive retinal vasculitis.  She did not report any features of systemic vasculitis or a connective tissue disease or inflammatory arthritis otherwise.  She has had chronic thinning of the scalp hair and dryness of the eyes as well as dryness of the mouth which she attributed to her medications. Apparently her laboratory work-up and evaluation by rheumatologist while in [place] were unrevealing.    She also has a history of biopsy-proven pulmonary histoplasmosis in 2008 without any known recurrence.     She was evaluated in neurology clinic by Dr. 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  She has had chronic migraine headaches.     We proceeded with extensive laboratory studies and chest x-rays.  In the absence of any other evidence of active inflammatory arthritis or connective tissue disease or vasculitis, we discussed that we would not be starting any immunosuppressive therapy and the patient stated that she will not take any prednisone in any case due to her previous experience. Therefore, she will continue to follow in ophthalmology clinic for this diagnosis and management and close monitoring meanwhile unless there is evidence of ongoing disease process requiring further immunosuppressive therapy, keeping in mind her previous history of histoplasmosis.    I will plan on seeing the patient back in a subsequent visit to review the test results and proceed with further evaluation and management as needed.  However, since my next available appointment may not be soon, I advised the patient to contact us via phone call or MyChart meanwhile or as needed.      Plan:    Please proceed with labs and chest x-rays and call us or contact us through MyChart for results.  A due to number of laboratory tests, she may decide on obtaining some of the labs today and some within the next few days or week.    She follows in ophthalmology clinic.    She had the first  2 doses of COVID-19 vaccination but is not considering the booster shot yet.    Return visit in 2-3 weeks or based on availability to review test results and any need for additional tests or plan.     I answered questions. Patient voiced understanding of the above discussion and agreement with plans.    Vladimir Crofts, MD      ADDENDUM:    Complete blood count without any anemia although with mild macrocytosis and MCV of 102.4 and with normal B12 at 759.  Normal inflammatory markers ESR and CRP.  Normal comprehensive metabolic panel and TSH.  Available autoimmune serologies were normal or negative including MPO/PR-3 and antiphospholipid antibodies.  Negative hepatitis B and C serologies with immunity to hepatitis B.  Normal urinalysis without hematuria or proteinuria.    The chest x-ray showed calcified granulomas without definite mediastinal or hilar adenopathy.    I referred her to pulmonary sarcoidosis clinic for their opinion and any need for additional evaluation such as CT chest.      Vladimir Crofts, MD                      This is a complex case with associated complex decision making. Several issues addressed. I spent over 70 minutes with this patient's encounter including counseling and coordination of care before and after the visit.

## 2020-08-19 ENCOUNTER — Encounter: Admit: 2020-08-19 | Discharge: 2020-08-19 | Payer: BC Managed Care – PPO

## 2020-08-20 ENCOUNTER — Encounter: Admit: 2020-08-20 | Discharge: 2020-08-20 | Payer: BC Managed Care – PPO

## 2020-08-20 NOTE — Telephone Encounter
Normal serum protein electrophoresis.  The ANA (antinuclear antibody) is only mildly elevated at 80 which is not diagnostic.  Negative cryoglobulin.  Low vitamin D of 13 with normal ACE level and vitamin D (1,25).  Negative urine histoplasma antigen.  We will await the pending labs for considering additional evaluation labs.    For now, you may start taking over-the-counter vitamin D3 at 2000 international units daily until completion of your work-up and if needed we could start prescription dose later.    Thanks

## 2020-08-26 ENCOUNTER — Encounter: Admit: 2020-08-26 | Discharge: 2020-08-26 | Payer: BC Managed Care – PPO

## 2020-08-26 DIAGNOSIS — R918 Other nonspecific abnormal finding of lung field: Secondary | ICD-10-CM

## 2020-08-26 DIAGNOSIS — E538 Deficiency of other specified B group vitamins: Secondary | ICD-10-CM

## 2020-08-26 DIAGNOSIS — N96 Recurrent pregnancy loss: Secondary | ICD-10-CM

## 2020-08-26 DIAGNOSIS — Z1589 Genetic susceptibility to other disease: Secondary | ICD-10-CM

## 2020-08-26 DIAGNOSIS — Z8619 Personal history of other infectious and parasitic diseases: Secondary | ICD-10-CM

## 2020-08-26 DIAGNOSIS — J4520 Mild intermittent asthma, uncomplicated: Secondary | ICD-10-CM

## 2020-08-26 DIAGNOSIS — J8410 Pulmonary fibrosis, unspecified: Secondary | ICD-10-CM

## 2020-08-26 DIAGNOSIS — H35063 Retinal vasculitis, bilateral: Principal | ICD-10-CM

## 2020-08-26 DIAGNOSIS — G43009 Migraine without aura, not intractable, without status migrainosus: Secondary | ICD-10-CM

## 2020-08-26 DIAGNOSIS — G479 Sleep disorder, unspecified: Secondary | ICD-10-CM

## 2020-08-26 NOTE — Telephone Encounter
VM left and pt notified MD reviewed labs and instructed she have labs drawn before her 10/13/20 appt. Pt notified mychart message would also be sent.

## 2020-08-26 NOTE — Telephone Encounter
-----  Message from Donia Ast, MD sent at 08/21/2020  6:47 PM CDT -----  Negative T spot TB.  Negative CCP antibody.  Negative serum histoplasma antigen.    Please arrange for a repeat ANA with reflex, Smith and RNP, double-stranded DNA, SCL 70, centromere, and Jo 1 antibodies, CBC with differential, ESR and CRP, and a CMP before the return visit in about 1 month.    Thanks

## 2020-08-28 ENCOUNTER — Encounter: Admit: 2020-08-28 | Discharge: 2020-08-28 | Payer: BC Managed Care – PPO

## 2020-09-30 ENCOUNTER — Encounter: Admit: 2020-09-30 | Discharge: 2020-09-30 | Payer: BC Managed Care – PPO

## 2020-09-30 ENCOUNTER — Ambulatory Visit: Admit: 2020-09-30 | Discharge: 2020-09-30 | Payer: BC Managed Care – PPO

## 2020-09-30 DIAGNOSIS — H35063 Retinal vasculitis, bilateral: Principal | ICD-10-CM

## 2020-09-30 DIAGNOSIS — Z8619 Personal history of other infectious and parasitic diseases: Secondary | ICD-10-CM

## 2020-09-30 DIAGNOSIS — J4520 Mild intermittent asthma, uncomplicated: Secondary | ICD-10-CM

## 2020-09-30 DIAGNOSIS — G43009 Migraine without aura, not intractable, without status migrainosus: Secondary | ICD-10-CM

## 2020-09-30 DIAGNOSIS — G479 Sleep disorder, unspecified: Secondary | ICD-10-CM

## 2020-09-30 DIAGNOSIS — J8410 Pulmonary fibrosis, unspecified: Secondary | ICD-10-CM

## 2020-09-30 DIAGNOSIS — Z1589 Genetic susceptibility to other disease: Secondary | ICD-10-CM

## 2020-09-30 DIAGNOSIS — N96 Recurrent pregnancy loss: Secondary | ICD-10-CM

## 2020-09-30 DIAGNOSIS — E538 Deficiency of other specified B group vitamins: Secondary | ICD-10-CM

## 2020-09-30 DIAGNOSIS — R918 Other nonspecific abnormal finding of lung field: Secondary | ICD-10-CM

## 2020-09-30 LAB — CBC AND DIFF
ABSOLUTE BASO COUNT: 0 K/UL (ref 0–0.20)
ABSOLUTE EOS COUNT: 0 K/UL (ref 0–0.45)
ABSOLUTE LYMPH COUNT: 1.9 K/UL (ref 1.0–4.8)
ABSOLUTE MONO COUNT: 0.4 K/UL (ref 0–0.80)
ABSOLUTE NEUTROPHIL: 2.9 K/UL (ref 1.8–7.0)
BASOPHILS %: 1 % (ref 0–2)
EOSINOPHILS %: 1 % (ref 0–5)
HEMATOCRIT: 34 % — ABNORMAL LOW (ref <1.0)
HEMOGLOBIN: 12 g/dL (ref 12.0–15.0)
LYMPHOCYTES %: 36 % (ref 24–44)
MCHC: 35 g/dL (ref 32.0–36.0)
MCV: 101 FL — ABNORMAL HIGH (ref 80–100)
MONOCYTES %: 8 % (ref 4–12)
NEUTROPHILS %: 54 % (ref 41–77)
PLATELET COUNT: 258 K/UL (ref 150–400)
RBC COUNT: 3.3 M/UL — ABNORMAL LOW (ref 4.0–5.0)
WBC COUNT: 5.5 K/UL (ref 4.5–11.0)

## 2020-09-30 LAB — COMPREHENSIVE METABOLIC PANEL
AST: 15 U/L — ABNORMAL HIGH (ref 7–40)
BLD UREA NITROGEN: 13 mg/dL (ref <1.0)
CREATININE: 0.5 mg/dL (ref 0.4–1.00)
EGFR: >60 mL/min (ref >60)
GLUCOSE,PANEL: 79 mg/dL (ref 70–100)
POTASSIUM: 4.2 MMOL/L (ref 3.5–5.1)
SODIUM: 139 MMOL/L (ref 137–147)

## 2020-09-30 LAB — CENTROMERE ANTIBODIES

## 2020-09-30 LAB — ANTI SMITH(SM) ANTI RNP AB

## 2020-09-30 LAB — SCL 70 ANTIBODIES: SCL70 AB: 0.7 AI (ref <1.0)

## 2020-09-30 LAB — SED RATE: ESR: 3 mm/h (ref 0–20)

## 2020-10-13 ENCOUNTER — Ambulatory Visit: Admit: 2020-10-13 | Discharge: 2020-10-14 | Payer: BC Managed Care – PPO

## 2020-10-13 ENCOUNTER — Encounter: Admit: 2020-10-13 | Discharge: 2020-10-13 | Payer: BC Managed Care – PPO

## 2020-10-13 DIAGNOSIS — G479 Sleep disorder, unspecified: Secondary | ICD-10-CM

## 2020-10-13 DIAGNOSIS — F4310 Post-traumatic stress disorder, unspecified: Secondary | ICD-10-CM

## 2020-10-13 DIAGNOSIS — G43909 Migraine, unspecified, not intractable, without status migrainosus: Secondary | ICD-10-CM

## 2020-10-13 DIAGNOSIS — R768 Other specified abnormal immunological findings in serum: Secondary | ICD-10-CM

## 2020-10-13 DIAGNOSIS — I671 Cerebral aneurysm, nonruptured: Secondary | ICD-10-CM

## 2020-10-13 DIAGNOSIS — B399 Histoplasmosis, unspecified: Secondary | ICD-10-CM

## 2020-10-13 DIAGNOSIS — J8410 Pulmonary fibrosis, unspecified: Secondary | ICD-10-CM

## 2020-10-13 DIAGNOSIS — E538 Deficiency of other specified B group vitamins: Secondary | ICD-10-CM

## 2020-10-13 DIAGNOSIS — G43009 Migraine without aura, not intractable, without status migrainosus: Secondary | ICD-10-CM

## 2020-10-13 DIAGNOSIS — Z8619 Personal history of other infectious and parasitic diseases: Secondary | ICD-10-CM

## 2020-10-13 DIAGNOSIS — H04123 Dry eye syndrome of bilateral lacrimal glands: Secondary | ICD-10-CM

## 2020-10-13 DIAGNOSIS — H35063 Retinal vasculitis, bilateral: Principal | ICD-10-CM

## 2020-10-13 DIAGNOSIS — R918 Other nonspecific abnormal finding of lung field: Secondary | ICD-10-CM

## 2020-10-13 DIAGNOSIS — H547 Unspecified visual loss: Secondary | ICD-10-CM

## 2020-10-13 DIAGNOSIS — N96 Recurrent pregnancy loss: Secondary | ICD-10-CM

## 2020-10-13 DIAGNOSIS — E559 Vitamin D deficiency, unspecified: Secondary | ICD-10-CM

## 2020-10-13 DIAGNOSIS — Z1589 Genetic susceptibility to other disease: Secondary | ICD-10-CM

## 2020-10-13 DIAGNOSIS — J4520 Mild intermittent asthma, uncomplicated: Secondary | ICD-10-CM

## 2020-10-13 NOTE — Patient Instructions
Labs on 08/18/2020 showed a complete blood count without any anemia although with mild macrocytosis and MCV of 102.4 and with normal B12 at 759.  Normal inflammatory markers ESR and CRP.  Normal comprehensive metabolic panel and TSH.  Available autoimmune serologies were normal or negative including MPO/PR-3 and antiphospholipid antibodies.  Negative hepatitis B and C serologies with immunity to hepatitis B.  Normal urinalysis without hematuria or proteinuria. Normal serum protein electrophoresis. ?The ANA (antinuclear antibody) is only mildly elevated at 80 which is not diagnostic. ?Negative cryoglobulin. ?Low vitamin D of 13 with normal ACE level and vitamin D (1,25). ?Negative urine histoplasma antigen. ?Negative T spot TB. ?Negative CCP antibody. ?Negative serum histoplasma antigen. The histoplasma yeast titers 1:32.  ?  The labs on 09/30/2020 showed improvement of mild macrocytosis at 101.7 without any anemia. ?Normal inflammatory markers ESR and CRP and comprehensive metabolic panel. ?ANA remains low and not a specific titer of 80 with normal or negative other autoimmune serologies including double-stranded DNA, Smith and RNP, centromere, Jo 1 and SCL 70.  ?  For now, you may start taking over-the-counter vitamin D3 at 2000 international units daily and it can be rechecked in the next couple months your primary care physician.  I did not prescribe higher dose in case she had any evidence of sarcoidosis.     The chest x-ray showed calcified granulomas without definite mediastinal or hilar adenopathy.     I referred her to pulmonary sarcoidosis clinic for their opinion and any need for additional evaluation such as CT chest.  She is not interested in this referral as the granulomas are old from her previous histoplasmosis and she feels that she is asymptomatic completely.  She plans to cancel the pulmonary appointment.    We also discussed referral to infectious diseases clinic to address the past history of histoplasmosis when she was treated with antifungal therapy in 2008 and reevaluation of this.  However, she is not symptomatic and she she does not wish to have a referral to ID clinic.    We discussed that if she develops any new symptoms or concerns or the retinal vasculitis or phlebitis recurs to consider these evaluations and in addition, if she is to be immunosuppressed for any other reason, to keep the histoplasmosis history in mind.     She follows in ophthalmology clinic.     She had the first 2 doses of COVID-19 vaccination but is not considering the booster shot yet.     Follow-up in in rheumatology clinic as needed.

## 2020-10-13 NOTE — Progress Notes
Date of Service: 10/13/2020    Subjective:             Brooke Zuniga is a 45 y.o. female.    History of Present Illness    This is a 45 year old Caucasian lady who returns for review of test results following initial consultation on 08/18/2020 when she was referred by her ophthalmologist Dr. Ronnald Collum for bilateral retinal phlebitis/vasculitis.  ?  As per initial consultation note:    She reported while in [place], [place] in fall 2018, she developed an acute episode of flashes of light in her left eye for which she sought medical attention initially in urgent care and finally saw an ophthalmologist with an initial diagnosis of ocular stroke for which she was seen in emergency department and had MRI of the brain and subsequently she was retinal specialist and was diagnosed apparently with retinal vasculitis and was treated with prednisone 60 mg daily which was stopped after 1 month which did not help and symptoms got worse and that she had involvement of the right eye.  She has had chronic migraine headaches.  MRI showed a spot that could have been due to migraine.  A diagnosis of MS was considered but not confirmed.  Her neurological work-up has been negative.  Despite extensive laboratory test, a clear diagnosis was not established.  She had a left eye and corticosteroid injection which led to glaucoma per patient's report.  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  She reported that she had mediastinoscopy with lymph node dissection which per patient confirmed the diagnosis in [place], [place].  She also saw a rheumatologist while in [place] without any diagnosis or treatment.  She changed her diet and went completely vegan and feels that this made a difference with the stability of her vision and without any recurrence.  She denied any arthralgia.  After she moved to [facility] area in 2019, she was seen at [facility] and the glucoma was treated.  Per note of Dr. Elpidio Anis the retinal vasculitis of unknown etiology.  Apparently she had extensive work-up in [place] and was started on prednisone 60 mg daily.  Apparently no vasculitis was seen in September 2019.  She also has dry eyes and retinal tear.  She was evaluated in neurology clinic by Dr. 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  MR brain 09/02/2018:   Scattered foci of nonspecific supratentorial white matter FLAIR hyperintensity in a bifrontal and subcortical predominant distribution, possibly due to clinically reported migraine headaches. No abnormal intracranial enhancing lesions.   MRA head 09/02/2018:   Tiny vascular infundibulum versus a 1 to 2 mm aneurysm along the posterior proximal left P1 segment. No significant intracranial arterial stenosis, occlusion, or arteriovenous malformation.   CTA head 03/04/2019:  1. ?No acute intracranial hemorrhage or mass effect.   2. ?No intracranial aneurysm or arteriovenous malformation is identified. Specifically, the left PCA findings on comparison MRA was presumably artifactual related to a tiny left P1 segment branch vessel or adjacent venous structure.   3. ?Unchanged left posterior communicating artery infundibulum.   Patient denied any history of strokes, seizures, thromboembolic events, but 3 first trimester miscarriages.  No history of idiopathic hearing loss.  No sicca or mucositis or dysphagia, abdominal  pain, hematochezia, melena or diarrhea.   No history of dyspnea, hemoptysis, cough, chest pain, or any known serositis. No history of nephritis or Raynaud's phenomenon.  No associated rash or photosensitivity. No history of psoriasis or inflammatory bowel disease.  No paresthesia, neuropathy, radiculopathy, or frank weakness.  No history of fevers, night sweats or unintentional weight loss.  She has had chronic thinning of the hair of the scalp and migraine headaches and occasional seasonal allergies.  Apparently MTHFR mutation was thought to be etiology of her miscarriages.  She is status post hysterectomy.  She works for [employer].  She is married with 1 adopted child.  She smokes cigars occasionally and drinks socially.  On her mother side her cousins have had ulcerative colitis or Crohn's disease, Graves' disease, and grandmother possibly with rheumatoid arthritis and alopecia.      Interval History:     She reported that she remains asymptomatic without any new complaints.  Her vision has remained stable as well.  She has not had any pulmonary symptoms related to the previous histoplasmosis and believes that the calcified granulomas seen on the CT chest are the old ones.  She has been treated for B12 deficiency which was diagnosed by neurologist.  She has been taking the over-the-counter vitamin D supplements as planned.  She is not interested in any additional evaluation including CT chest or referral to pulmonary ID clinics.  Patient denies any constitutional symptoms such as fevers, night sweats or unintentional weight loss.         Review of Systems   Eyes: Positive for photophobia.   Endocrine: Positive for cold intolerance.   All other systems reviewed and are negative.    Medical History:   Diagnosis Date   ? Histoplasmosis 2008   ? Migraines    ? Vision decreased      Surgical History:   Procedure Laterality Date   ? DILATION AND CURETTAGE  2000   ? DILATION AND CURETTAGE  2001   ? PROPHYLAXIS RETINAL DETACHMENT - 1 OR MORE SESSION - PHOTOCOAGULATION Right 08/05/2017    Performed by Ronnald Collum, MD at [facility]   ? REMOVAL FOREIGN BODY EXTERNAL EYE - CONJUNCTIVAL SUPERFICIAL Left 01/04/2018    Performed by Lorel Monaco, MD at [facility] [place]   ? MANUAL/ MECHANICAL EXTRACAPSULAR CATARACT REMOVAL WITH INSERTION INTRAOCULAR LENS PROSTHESIS - 1 STAGE Left 03/01/2018    Performed by Lorel Monaco, MD at [facility]   ? FISTULIZATION SCLERA/ TRABULECTOMY AB EXTERNO IN ABSENCE OF PREVIOUS SURGERY Left 03/01/2018    Performed by Lorel Monaco, MD at [facility] [place]   ? EAR TUBES     ? HX HYSTERECTOMY     ? LASIK Bilateral     unsuccessful   ? LYMPH NODE DISSECTION     ? VARICOSE VEIN SURGERY       Family History   Problem Relation Age of Onset   ? Cataract Father    ? Diabetes Father    ? Stroke Father    ? Cancer Mother    ? Migraines Mother    ? Migraines Sister    ? Diabetes Maternal Grandmother    ? Diabetes Maternal Grandfather    ? Diabetes Paternal Grandmother    ? Stroke Paternal Grandmother    ? Diabetes Paternal Grandfather    ? Cancer Maternal Aunt    ? Migraines Maternal Aunt    ? Neuropathy Maternal Aunt    ? Glaucoma Neg Hx    ?  Macular Degen Neg Hx      Social History     Socioeconomic History   ? Marital status: Married   Tobacco Use   ? Smoking status: Current Some Day Smoker     Types: Cigars   ? Smokeless tobacco: Never Used   ? Tobacco comment: cigars once in a while   Vaping Use   ? Vaping Use: Never used   Substance and Sexual Activity   ? Alcohol use: Yes     Comment: occaisonally    ? Drug use: Never     Vaping/E-liquid Use   ? Vaping Use Never User                   Objective:         ? coenzyme Q10 100 mg cap Take 100 mg by mouth.   ? cyanocobalamin (RUBRAMIN) 1,000 mcg/mL injection Inject 1,000 mcg into the muscle every 30 days.   ? levalbuterol tartrate(+) (XOPENEX HFA) 45 mcg/actuation inhaler Inhale 2 puffs by mouth into the lungs every 4-6 hours as needed for Wheezing or Shortness of Breath.   ? methylphenidate HCL (CONCERTA) 18 mg CR tablet Take 18 mg by mouth daily   ? riboflavin (VITAMIN B-2) 100 mg tab Take 100 mg by mouth daily.   ? rizatriptan (MAXALT-MLT) 5 mg rapid dissolve tablet Take one tablet by mouth at onset of headache. May repeat in 2 hours if needed. (Patient taking differently: Dissolve 5 mg by mouth as Needed. Take one tablet by mouth at onset of headache. May repeat in 2 hours if needed.)   ? traZODone (DESYREL) 50 mg tablet as Needed.     Vitals:    10/13/20 0855   BP: 109/56   BP Source: Arm, Right Upper   Pulse: 48   Temp: 37 ?C (98.6 ?F)   Resp: 18   SpO2: 100%   TempSrc: Oral   PainSc: Zero   Weight: 85.5 kg (188 lb 6.4 oz)   Height: 172.7 cm (5' 8)     Body mass index is 28.65 kg/m?Marland Kitchen     Physical Exam  Constitutional:       General: She is not in acute distress.     Appearance: She is not ill-appearing.   HENT:      Mouth/Throat:      Mouth: Mucous membranes are moist. No oral lesions.      Comments: No parotid enlargement or tenderness.   Eyes:      Conjunctiva/sclera: Conjunctivae normal.   Cardiovascular:      Rate and Rhythm: Normal rate and regular rhythm.      Pulses: Normal pulses.      Heart sounds: Normal heart sounds. No murmur heard.  Pulmonary:      Effort: Pulmonary effort is normal.      Breath sounds: Normal breath sounds. No wheezing, rhonchi or rales.   Musculoskeletal:      Cervical back: Normal range of motion.      Comments: No synovitis of any of the joints of upper and lower extremities bilaterally.  Makes full fist bilaterally.  No dactylitis. Range of motion of extremities are within functional limits.    Lymphadenopathy:      Cervical: No cervical adenopathy.   Skin:     Comments: No malar rash.  No sclerodactyly or sclerosis elsewhere, telangectasia, or calcinosis. No heliotrope rash, Gottron's papules, Shawl or V sign. Nailfold capillaroscopy negative. No digital ulcerations.  No psoriatic plaques or  obvious nail pitting. Professional tattoos.    Neurological:      Mental Status: She is alert.      Comments: No weakness of the proximal and distal upper and lower extremities and the cervical muscles.     Psychiatric:         Mood and Affect: Mood normal.         Speech: Speech normal.         Behavior: Behavior normal.              Assessment and Plan:    1. Retinal vasculitis of both eyes    2. Retinal phlebitis, bilateral    3. History of histoplasmosis    4. Lung nodules    5. Calcified granuloma of lung (HCC)    6. Mild intermittent asthma without complication    7. B12 deficiency    8. History of recurrent miscarriages    9. Sleep disturbance    10. Migraine without aura and without status migrainosus, not intractable    11. MTHFR gene mutation    12. 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 PTSD (post-traumatic stress disorder)    15. Vitamin D deficiency    16. Elevated antinuclear antibody (ANA) level            The patient has had a diagnosis of retinal phlebitis/vasculitis left greater than right since 2018, initially with negative work-up while in [place] and for which she was treated with prednisone 60 mg daily for 1 month without reported improvement.  She was diagnosed with glaucoma.  After moving back to [facility] area, the glaucoma has been managed and she has not had any progressive retinal vasculitis. She has had chronic thinning of the scalp hair and dryness of the eyes as well as dryness of the mouth which she attributed to her medications.She did not report any features of systemic vasculitis or a connective tissue disease or inflammatory arthritis otherwise.  Apparently her laboratory work-up and evaluation by rheumatologist while in [place] were unrevealing.     She also has a history of biopsy-proven pulmonary histoplasmosis in 2008 without any known recurrence.      She was evaluated in neurology clinic by Dr. 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  She has had chronic migraine headaches.      We proceeded with extensive laboratory studies and chest x-rays.  In the absence of any other evidence of active inflammatory arthritis or connective tissue disease or vasculitis, we discussed that we would not be starting any immunosuppressive therapy and the patient stated that she will not take any prednisone in any case due to her previous experience. Therefore, she will continue to follow in ophthalmology clinic for this diagnosis and management and close monitoring meanwhile unless there is evidence of ongoing disease process requiring further immunosuppressive therapy, keeping in mind her previous history of histoplasmosis.        Plan:    She is remained asymptomatic without any new complaints suggestive of an inflammatory arthritis or connective tissue disease or any pulmonary symptoms in the setting of remote history of treated histoplasmosis.  The low titer ANA could be nonspecific.  He does not have any vision complaints either.  She is not interested in any additional evaluation including referral to infectious diseases and pulmonary clinics and is planning to cancel the scheduled pulmonary consultation sarcoidosis clinic.  Discussed that the intention was to evaluate for any other underlying disease process that may have played a role in her past or recent history and diagnoses.  Therefore, I advised her to follow with her ophthalmologist and primary care physician and return to rheumatology clinic as needed or if there is any new questions or concerns or symptoms.  In that case, she may send [place] a message via MyChart.  In addition, if she is immunosuppressed for any reason in the future, her physicians may want to keep the history of histoplasmosis in mind to further evaluate or manage accordingly.    Labs on 08/18/2020 showed a complete blood count without any anemia although with mild macrocytosis and MCV of 102.4 and with normal B12 at 759.  Normal inflammatory markers ESR and CRP.  Normal comprehensive metabolic panel and TSH.  Available autoimmune serologies were normal or negative including MPO/PR-3 and antiphospholipid antibodies.  Negative hepatitis B and C serologies with immunity to hepatitis B.  Normal urinalysis without hematuria or proteinuria. Normal serum protein electrophoresis. ?The ANA (antinuclear antibody) is only mildly elevated at 80 which is not diagnostic. ?Negative cryoglobulin. ?Low vitamin D of 13 with normal ACE level and vitamin D (1,25). ?Negative urine histoplasma antigen. ?Negative T spot TB. ?Negative CCP antibody. ?Negative serum histoplasma antigen. The histoplasma yeast titers 1:32.  ?  The labs on 09/30/2020 showed improvement of mild macrocytosis at 101.7 without any anemia. ?Normal inflammatory markers ESR and CRP and comprehensive metabolic panel. ?ANA remains low and not a specific titer of 80 with normal or negative other autoimmune serologies including double-stranded DNA, Smith and RNP, centromere, Jo 1 and SCL 70.  ?  For now, you may start taking over-the-counter vitamin D3 at 2000 international units daily and it can be rechecked in the next couple months your primary care physician.  I did not prescribe higher dose in case she had any evidence of sarcoidosis.     The chest x-ray showed calcified granulomas without definite mediastinal or hilar adenopathy.     I referred her to pulmonary sarcoidosis clinic for their opinion and any need for additional evaluation such as CT chest.  She is not interested in this referral as the granulomas are old from her previous histoplasmosis and she feels that she is asymptomatic completely.  She plans to cancel the pulmonary appointment.    We also discussed referral to infectious diseases clinic to address the past history of histoplasmosis when she was treated with antifungal therapy in 2008 and reevaluation of this.  However, she is not symptomatic and she she does not wish to have a referral to ID clinic.    We discussed that if she develops any new symptoms or concerns or the retinal vasculitis or phlebitis recurs to consider these evaluations and in addition, if she is to be immunosuppressed for any other reason, to keep the histoplasmosis history in mind.     She follows in ophthalmology clinic.     She had the first 2 doses of COVID-19 vaccination but is not considering the booster shot yet.     Follow-up in in rheumatology clinic as needed.     I answered questions. Patient voiced understanding of the above discussion and agreement with plans.     Vladimir Crofts, MD                              This is a complex case with  associated complex decision making. I spent over 40 minutes with this patient's encounter including counseling and coordination of care before and after the visit.  I subsequently sent a message to one of the infectious diseases specialists.

## 2020-10-17 ENCOUNTER — Encounter: Admit: 2020-10-17 | Discharge: 2020-10-17 | Payer: BC Managed Care – PPO

## 2020-10-17 NOTE — Telephone Encounter
This RN called pt and left vm asking for a return call to discuss MD's recommendations. Call back number given.

## 2020-10-17 NOTE — Telephone Encounter
-----   Message from Vladimir Crofts, MD sent at 10/16/2020 11:42 AM CDT -----  Please advise the patient that I discussed her histoplasma titer and past infection with one of the infectious diseases specialist who stated that the histoplasma titer is in the range that they would typically see the patient and investigate further such as with a CT.    Although, she has remained asymptomatic, I would be happy to refer her to infectious diseases and pulmonary clinics for their input, if she is interested.  Otherwise, she would follow with her ophthalmologist Dr. Ronnald Collum and her primary care physician Dr. Caffie Damme in regard to this especially if she is going to be started on immunosuppression to reconsider evaluation infectious diseases clinic.    Thanks

## 2020-12-08 ENCOUNTER — Encounter: Admit: 2020-12-08 | Discharge: 2020-12-08 | Payer: BC Managed Care – PPO

## 2020-12-08 ENCOUNTER — Ambulatory Visit: Admit: 2020-12-08 | Discharge: 2020-12-08 | Payer: BC Managed Care – PPO

## 2020-12-08 DIAGNOSIS — G43909 Migraine, unspecified, not intractable, without status migrainosus: Secondary | ICD-10-CM

## 2020-12-08 DIAGNOSIS — H35063 Retinal vasculitis, bilateral: Secondary | ICD-10-CM

## 2020-12-08 DIAGNOSIS — H547 Unspecified visual loss: Secondary | ICD-10-CM

## 2020-12-08 DIAGNOSIS — B399 Histoplasmosis, unspecified: Secondary | ICD-10-CM

## 2020-12-08 DIAGNOSIS — R918 Other nonspecific abnormal finding of lung field: Secondary | ICD-10-CM

## 2020-12-08 NOTE — Progress Notes
Date of Service: 12/08/2020     Referring physician: Berdie Ogren    Reason for consult: Histoplasmosis    Subjective:             Brooke Zuniga is a 45 y.o. female.    History of Present Illness    Brooke Zuniga is a 45yo F with retinal phlebitis/vasculitis L>R diagnosed in 2018. She received prednisone 60mg , then STK OS in December 2018.  She is referred to [facility] because in 2008 in [place], [place], she had significant symptoms after waking up one day. She had SOB, DOE. Brooke Zuniga also developed fevers and chills. Eval with CT chest showed multiple nodules and bulky LAD. Eventually had mediastinoscopy and LN excisions, diagnosed with histoplasmosis at that time. On itra for 9-76mos. Unnamed states that she felt better once she had the lymph node excision and removal of the bulky LNs. It was a couple months form symptoms onset to initiation of treatment.   In June she was evaluated by rheumatology and chest x-ray showed calcified granulomas. Studies for histo gave negative serum and urine histo antigen, but histo yeast titer 1:32, but mycelial ab not detected.   Currently she is without systemic or local symptoms of SOB, cough, DOE.   Overall she feels her vision is improved form 2018.    Active Ambulatory Problems     Diagnosis Date Noted   ? Retinal phlebitis, bilateral 08/02/2017   ? 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 Glaucoma syndrome 02/07/2018   ? Steroid responder, left eye 02/07/2018   ? Vertigo 08/03/2018   ? Aneurysm of posterior cerebral artery 08/30/2018   ? Anisocoria 09/19/2018   ? Retinal tear of right eye 09/19/2018   ? Dry eye syndrome of both eyes 09/19/2018   ? Macrocytosis 03/13/2019   ? Sleep disturbance 12/26/2019   ? PTSD (post-traumatic stress disorder) 10/02/2012   ? MTHFR gene mutation 01/09/2016   ? Mild intermittent asthma without complication 09/30/2016   ? Migraine without aura 12/22/2017   ? Lung nodules 04/11/2018   ? Left eye pain 12/02/2016   ? Fatigue 09/30/2016   ? Cold intolerance 11/03/2016   ? B12 deficiency 12/26/2019   ? Sleep disorder, unspecified 02/06/2020     Resolved Ambulatory Problems     Diagnosis Date Noted   ? No Resolved Ambulatory Problems     Past Medical History:   Diagnosis Date   ? Histoplasmosis 2008   ? Migraines    ? Vision decreased        Social History     Socioeconomic History   ? Marital status: Married   Tobacco Use   ? Smoking status: Current Some Day Smoker     Types: Cigars   ? Smokeless tobacco: Never Used   ? Tobacco comment: cigars once in a while   Vaping Use   ? Vaping Use: Never used   Substance and Sexual Activity   ? Alcohol use: Yes     Comment: occaisonally    ? Drug use: Never   no contact with chickens, caves, chicken coops, birds.     Family History   Problem Relation Age of Onset   ? Cataract Father    ? Diabetes Father    ? Stroke Father    ? Cancer Mother    ? Migraines Mother    ? Migraines Sister    ? Diabetes Maternal Grandmother    ? Diabetes Maternal Grandfather    ? Diabetes  Paternal Grandmother    ? Stroke Paternal Grandmother    ? Diabetes Paternal Grandfather    ? Cancer Maternal Aunt    ? Migraines Maternal Aunt    ? Neuropathy Maternal Aunt    ? Glaucoma Neg Hx    ? Macular Degen Neg Hx        Allergies   Allergen Reactions   ? Ceclor [Cefaclor] RASH     Anaphylaxis   ? Prednisone SEE COMMENTS              Review of Systems  No f/c/ns, cough, SOB, DOE. No worsneing of vision. No HA, neck pain. No new skin lesions, abd pain. Rest of 14-point ROS negative     Objective:         ? coenzyme Q10 100 mg cap Take 100 mg by mouth.   ? cyanocobalamin (RUBRAMIN) 1,000 mcg/mL injection Inject 1,000 mcg into the muscle every 30 days.   ? levalbuterol tartrate(+) (XOPENEX HFA) 45 mcg/actuation inhaler Inhale 2 puffs by mouth into the lungs every 4-6 hours as needed for Wheezing or Shortness of Breath.   ? methylphenidate HCL (CONCERTA) 18 mg CR tablet Take 18 mg by mouth daily   ? riboflavin (VITAMIN B-2) 100 mg tab Take 100 mg by mouth daily.   ? rizatriptan (MAXALT-MLT) 5 mg rapid dissolve tablet Take one tablet by mouth at onset of headache. May repeat in 2 hours if needed. (Patient taking differently: Dissolve 5 mg by mouth as Needed. Take one tablet by mouth at onset of headache. May repeat in 2 hours if needed.)   ? traZODone (DESYREL) 50 mg tablet as Needed.     Vitals:    12/08/20 1337   BP: 124/77   Pulse: 74   Temp: 36.7 ?C (98.1 ?F)   PainSc: Zero   Weight: 85.3 kg (188 lb)   Height: 172.7 cm (5' 8)     Body mass index is 28.59 kg/m?Marland Kitchen     Physical Exam  General: A&Ox3, NAD  Head: Atraumatic, normocephalic  Eyes: EOMI, no subconjunctival hemorrhages  Neck: No nuchal rigidity, no ttp, symetric  Mouth: No oropharyngeal lesions  Heart: RRR, no murmurs  Lungs: CTAB, no wheezes, no crackles  Spine: No ttp along spine  Abdomen: Soft, NT, +BS  Extremities: No edema, no joint swelling, effusions or ttp  Derm: No rashes or lesions      LABS:  Full labs reviewed    08/18/20 CXR:  IMPRESSION   Calcified granulomas.   No definite mediastinal or hilar adenopathy..        Assessment and Plan:  1. Mediastinal LN biopsy-proven pulmonary histoplasmosis dx 2008 s/p itra [id number]  2. Retinal phlebitis/vasculitis L>R since 2018  3. Chronic migraine headaches      PLAN:   1. Currently she is without local or systemic symptoms of disease. She was treated for months with antifungal therapy. Complement fixation titers of 1:32 or higher are highly suggestive of acute infection, although titers of 1:8 or 1:16 should not be disregarded because titers in this range occur in about one-third of cases with active disease. However, positive results with the complement fixation test may represent a persistent antibody response from a previous episode of histoplasmosis or infection with other fungi (eg, coccidioidomycosis, blastomycosis). Positive results may also occur in patients with other granulomatous diseases (eg, sarcoidosis, tuberculosis)   2. We can recheck Ab test to see if titer has increased, however, if the same or less, we  will follow only symptoms to direct further evaluation.  3. Will also discuss with Dr. Elpidio Anis.

## 2020-12-09 ENCOUNTER — Encounter: Admit: 2020-12-09 | Discharge: 2020-12-09 | Payer: BC Managed Care – PPO

## 2021-01-27 ENCOUNTER — Encounter: Admit: 2021-01-27 | Discharge: 2021-01-27 | Payer: BC Managed Care – PPO

## 2021-02-02 ENCOUNTER — Encounter: Admit: 2021-02-02 | Discharge: 2021-02-02 | Payer: BC Managed Care – PPO

## 2021-02-02 NOTE — Progress Notes
Obtained patient's verbal consent to treat them and their agreement to [facility] financial policy and NPP via this telehealth visit during the [employer] Emergency  Zoom Consent: Audio + Video utilized  This visit was completed via Zoom due to the restrictions of the COVID-19 pandemic. All issues documented were discussed and addressed but no physical exam was performed unless allowed by visual confirmation on Zoom. If it was felt that the patient should be evaluated in clinic then they were directed there. Patient verbally consented to visit and understands diagnostic limitations of not having ability to complete full physical examination.     CC: Migraine Follow Up       History of Present Illness  Brooke Zuniga is a 45 y.o. female  who is a patient of Dr. Verna Czech who presents to clinic today for follow up on chronic migraines. She reached out to clinic on 11/21 for worsening migraines since discontinuing Topiramate in May. She currently uses Rizatriptan 5mg  as needed for severe migraines. Please see below for any updates or changes to current migraine characteristics.       Onset: 20's  Location: base of the skull, other times behind her eyes  Quality: pressure  Severity: 7/10  Average number of headache days per month: 1-2 migraines per month  Duration: 8+ hours  Aggravating symptoms: nausea, vomiting, photophobia, phonophobia, dizziness--> most bothersome symptom. This has been progressively worsening.     Dizziness started in 2019.    Dizziness is occurring every day, but the duration is lasting longer. She feels like her eyes are bouncing and impaired gait. Dizziness cannot be provoked. Time is the only thing that makes dizziness improve.     Denies: lacrimation/rhinorrhea, positional changes, ringing/roaring of the ears  Triggers: unable to identify   Aura: denies  Last Eye Exam: She goes every 6 months. She has history of retinopapillitis. She had elevated pressure before she had surgery (end of 2019) that was caused by a steroid injection to the eye.      She did see an ENT and inner ear issues were ruled out.    Current treatments:  75 mg topiramate every morning and 50 mg topiramate every evening.    Prior treatments:  imitrex- made her feel like a zombie  Propanolol- worsened asthma     Vestibular testing in 2019 which was reported normal.       Previous Work Up/Imaging:  MRI from 2018 was reviewed and showed patchy FLAIR hyperintensities.  MRI from 2020 again showed scattered FLAIR hyperintensities, most consistent with migraine.  MRA head from 2020 showed a possible 1 to 2 mm aneurysm along the posterior proximal left P1 segment.  CTA head from December 2020 showed no intracranial aneurysm.  Felt the initial aneurysm was artifactual.     Medical History:   Diagnosis Date   ? Histoplasmosis 2008   ? Migraines    ? Vision decreased      Surgical History:   Procedure Laterality Date   ? DILATION AND CURETTAGE  2000   ? DILATION AND CURETTAGE  2001   ? PROPHYLAXIS RETINAL DETACHMENT - 1 OR MORE SESSION - PHOTOCOAGULATION Right 08/05/2017    Performed by Ronnald Collum, MD at [facility] OR   ? REMOVAL FOREIGN BODY EXTERNAL EYE - CONJUNCTIVAL SUPERFICIAL Left 01/04/2018    Performed by Lorel Monaco, MD at [facility] OR   ? MANUAL/ MECHANICAL EXTRACAPSULAR CATARACT REMOVAL WITH INSERTION INTRAOCULAR LENS PROSTHESIS - 1 STAGE Left 03/01/2018  Performed by Lorel Monaco, MD at [facility] OR   ? FISTULIZATION SCLERA/ TRABULECTOMY AB EXTERNO IN ABSENCE OF PREVIOUS SURGERY Left 03/01/2018    Performed by Lorel Monaco, MD at [facility] OR   ? EAR TUBES     ? HX HYSTERECTOMY     ? LASIK Bilateral     unsuccessful   ? LYMPH NODE DISSECTION     ? VARICOSE VEIN SURGERY       Social History     Socioeconomic History   ? Marital status: Married   Tobacco Use   ? Smoking status: Some Days     Types: Cigars   ? Smokeless tobacco: Never   ? Tobacco comments:     cigars once in a while   Vaping Use   ? Vaping Use: Never used   Substance and Sexual Activity   ? Alcohol use: Yes     Comment: occaisonally    ? Drug use: Never     Family History   Problem Relation Age of Onset   ? Cataract Father    ? Diabetes Father    ? Stroke Father    ? Cancer Mother    ? Migraines Mother    ? Migraines Sister    ? Diabetes Maternal Grandmother    ? Diabetes Maternal Grandfather    ? Diabetes Paternal Grandmother    ? Stroke Paternal Grandmother    ? Diabetes Paternal Grandfather    ? Cancer Maternal Aunt    ? Migraines Maternal Aunt    ? Neuropathy Maternal Aunt    ? Glaucoma Neg Hx    ? Macular Degen Neg Hx        Review of Systems   Eyes: Positive for photophobia.   Neurological: Positive for dizziness and headaches.         Objective:         ? coenzyme Q10 100 mg cap Take 100 mg by mouth.   ? cyanocobalamin (RUBRAMIN) 1,000 mcg/mL injection Inject 1,000 mcg into the muscle every 30 days.   ? ergocalciferol (vitamin D2) (VITAMIN D PO) Take  by mouth.   ? levalbuterol tartrate(+) (XOPENEX HFA) 45 mcg/actuation inhaler Inhale 2 puffs by mouth into the lungs every 4-6 hours as needed for Wheezing or Shortness of Breath.   ? methylphenidate HCL (CONCERTA) 18 mg CR tablet Take 18 mg by mouth daily   ? riboflavin (VITAMIN B-2) 100 mg tab Take 100 mg by mouth daily.   ? rizatriptan (MAXALT-MLT) 5 mg rapid dissolve tablet Take one tablet by mouth at onset of headache. May repeat in 2 hours if needed. (Patient taking differently: Dissolve 5 mg by mouth as Needed. Take one tablet by mouth at onset of headache. May repeat in 2 hours if needed.)   ? topiramate (TOPAMAX) 25 mg tablet Take one tablet by mouth every 12 hours. Take 1 tablet by mouth at night X1 week. Then take 1 tablet by mouth at morning and at night X1 week. Take 1 tablet by mouth at morning and 2 tablets at night X1 week. Then take 2 tablets in morning and 2 tablets at night.  Indications: migraine prevention     There were no vitals filed for this visit.  There is no height or weight on file to calculate BMI.     Physical Exam    Alert and in no distress.  Converses and answers questions appropriately.  Speech is normal without dysarthria.  Face is symmetric with  normal movements.       Assessment and Plan:    Impression:  1. Chronic migraine without aura. Recently worsened over the last 3 weeks after stopping Topiramate. She is back to experiencing dizziness on a daily basis.   2. Migraine with associated vertigo.    Plan:  Restart Topiramate (Topamax) for vestibular migraines. -- Topamax trial for symptoms.  Increase the topamax as follows:     AM PM  Week 1   25mg   Week 2  25mg     25mg   Week 3  25mg  50mg   Week 4  50mg     50mg     --Take folic acid 1mg  daily and Calcium/Vitamin D while taking this medication. These are found over the counter.   --Please note side effects include, but are not limited to numbness, tingling, kidney stones, alteration in taste, weight loss. There is also slight risk for vision changes. If you have significant vision changes while taking this medication, please see an ophthalmologist right away. This can rarely cause some cognitive slowing (mainly with word finding) at higher doses (usually >200mg /day). If you have any mood changes/suicidal thoughts, stop medication right away and give [place] a call.   --Please remember that this medication can cause birth defects, so you should not become pregnant while taking this. Also, higher doses (usually >200mg /day) can cause some birth control to be ineffective, so please use a back up method.    2. Continue 5 mg Maxalt daily as needed for acute treatment of migraine.  Take at the initial onset of migraine.  Can take a second dose if needed after 2 hours.    3. Discussed if dizziness does not improve after restarting Topiramate, will likely want updated imaging (MRI with IAC)    4. Follow up with Sherene Sires, APRN in 3 months to discuss symptoms.    Total of 20 minutes were spent on the same day of the visit including preparing to see the patient, obtaining   and/or reviewing separately obtained history counseling and educating the patient/family/caregiver, ordering medications, tests, or   procedures, referring and communication with other health care professionals, documenting clinical   information in the electronic or other health record, independently interpreting results and communicating   results to the patient/family/caregiver, and care coordination.

## 2021-02-02 NOTE — Telephone Encounter
Patient left voicemail stating "I saw Dr. Assunta Curtis on 02/06/20 and during that appointment he had talked about stopping the topiramate that I was on and I sent a message in May regarding that and he suggested to lower it, and lower it and lower it until I was completely off of it so I did however for the past probably 2.5-3 weeks I have had the same symptoms that originally put me on the topiramate so I was wondering if I could get an appointment or would he suggest as far as making it so that I can function throughout the day rather than be sick with the vertigo and stuff like that." requested a call back.     This RN called to follow up: patient states that she would like to get back on some sort of medication because now for the last 2 or 3 weeks her symptoms of vertigo and migraines are worsening and she is unable to function at work.      This RN scheduled follow-up visit with [place], APRN 02/03/2021 at 4:20 pm.

## 2021-02-03 ENCOUNTER — Ambulatory Visit: Admit: 2021-02-03 | Discharge: 2021-02-04 | Payer: BC Managed Care – PPO

## 2021-02-03 ENCOUNTER — Encounter: Admit: 2021-02-03 | Discharge: 2021-02-03 | Payer: BC Managed Care – PPO

## 2021-02-03 DIAGNOSIS — H547 Unspecified visual loss: Secondary | ICD-10-CM

## 2021-02-03 DIAGNOSIS — R42 Dizziness and giddiness: Secondary | ICD-10-CM

## 2021-02-03 DIAGNOSIS — G43909 Migraine, unspecified, not intractable, without status migrainosus: Secondary | ICD-10-CM

## 2021-02-03 DIAGNOSIS — B399 Histoplasmosis, unspecified: Secondary | ICD-10-CM

## 2021-02-03 DIAGNOSIS — G43709 Chronic migraine without aura, not intractable, without status migrainosus: Secondary | ICD-10-CM

## 2021-02-03 MED ORDER — TOPIRAMATE 25 MG PO TAB
25 mg | ORAL_TABLET | Freq: Two times a day (BID) | ORAL | 3 refills | Status: AC
Start: 2021-02-03 — End: ?

## 2021-02-09 ENCOUNTER — Ambulatory Visit: Admit: 2021-02-09 | Discharge: 2021-02-09 | Payer: BC Managed Care – PPO

## 2021-02-09 ENCOUNTER — Encounter: Admit: 2021-02-09 | Discharge: 2021-02-09 | Payer: BC Managed Care – PPO

## 2021-02-09 DIAGNOSIS — H40052 Ocular hypertension, left eye: Secondary | ICD-10-CM

## 2021-02-09 DIAGNOSIS — H547 Unspecified visual loss: Secondary | ICD-10-CM

## 2021-02-09 DIAGNOSIS — B399 Histoplasmosis, unspecified: Secondary | ICD-10-CM

## 2021-02-09 DIAGNOSIS — G43909 Migraine, unspecified, not intractable, without status migrainosus: Secondary | ICD-10-CM

## 2021-02-09 NOTE — Progress Notes
ASSESSMENT:   ?  Steroid Responder / OHTN, OS  -last seen October, 2021  -s/p CE + PCIOL + trabeculectomy/[facility]MC (03/01/18 OS)  -CCT?thin, iop off gtts is excellent, bleb looks great   -FHx (-), Trauma (-), Steroids (-)  -HVF?4/21 ?OCT?4/21?DFE 4/21?GONIO 10/19?FP x   ?  Hx of NAION, OS  ?  Hx of Corneal Ulcer, OS  -many years ago, stable  ?  Lasik, OU  ?  Retinal Vasculitis, OU  -etiology unknown   -had oral steroids and STK in [place]  -last saw Dr. Elpidio Anis in February  ?  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  S/p PCIOL OS  - Some inf PCO but not in visual axis  ?  Hx retinal tear OD s/p laser  - Stable, no other tears or breaks  - Follows with Ajlan  ?  ?  PLAN:  ?  IOP great off drops, testing stable   Continue to monitor off drops  Cautioned about redness, discharge, decreased vision in OS due to surgery    Aquilla Solian, MD  Ophthalmology PGY-2    ATTESTATION    I personally performed the key portions of the E/M visit, discussed case with resident and concur with resident documentation of history, physical exam, assessment, and treatment plan unless otherwise noted.    Staff name:  Mariana Kaufman MD Date:  02/09/2021            Return in about 1 year (around 02/09/2022) for HVF, IOP check, OCT-n.      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 No pain, blurred vision since original episode in 2018.  Eye Exam: Former Alapati patient.  She denies blurry vision and denies pain, itching, and redness.  She does not use any gtts currently.      Feels like vision is stable.         Exam:  Base Eye Exam     Visual Acuity (Snellen - Linear)       Right Left    Dist cc 20/20 -1 20/20    Correction: Glasses          Tonometry (Tonopen, 9:37 AM)       Right Left    Pressure 17 12          Tonometry #2 (Applanation, 9:58 AM)       Right Left    Pressure 15 13          Pupils       Dark Shape React APD    Right 3 Round Brisk None    Left 3 Round Brisk None          Visual Fields HVF           Extraocular Movement       Right Left     Full Full          Neuro/Psych     Oriented x3: Yes    Mood/Affect: Normal            Slit Lamp and Fundus Exam     External Exam       Right Left    External Normal Normal          Slit Lamp Exam       Right Left    Lids/Lashes Normal Normal    Conjunctiva/Sclera White and quiet sup bleb    Cornea Clear Clear    Anterior Chamber Deep  and quiet Deep and quiet    Iris Flat patent superior PI    Lens Clear Posterior chamber intraocular lens, trc inferior PCO not in visual axis    Anterior Vitreous Normal Normal          Fundus Exam       Right Left    Disc Sharp, healthy rim, very full nerves, no obscurations of vessels sharp, healthy rim, full nerves, no obvscuration of vessels    C/D Ratio 0.1 0.1    Macula Flat Flat    Periphery  Attached, no breaks or tears            Refraction     Wearing Rx       Sphere Cylinder Axis    Right -1.50 Sphere     Left -1.25 +1.00 102    Age: <46yr    Type: SVL                VISUAL FIELD, EXTEND        Humphrey Automated exam type was used.     Right Eye  Threshold was 24-2. Strategy was SITA Standard. Reliability was good. Progression has been stable. Normal     Left Eye  Threshold was 24-2. Strategy was SITA Standard. Reliability was good. Progression has been stable. Normal     Notes  Normal OU       OCT OPTIC NERVE        Optic Nerve  Right Eye  Findings location Superior Optic nerve findings: Abnormal Thinning Progression has been stable.     Left Eye  Findings location Superior Optic nerve findings: Abnormal Thinning Progression has been stable.     Notes  RNFL 100/91--both unchanged

## 2021-02-11 ENCOUNTER — Encounter: Admit: 2021-02-11 | Discharge: 2021-02-11 | Payer: BC Managed Care – PPO

## 2021-04-20 ENCOUNTER — Encounter: Admit: 2021-04-20 | Discharge: 2021-04-20 | Payer: BC Managed Care – PPO

## 2021-04-20 DIAGNOSIS — G43709 Chronic migraine without aura, not intractable, without status migrainosus: Secondary | ICD-10-CM

## 2021-04-20 DIAGNOSIS — R42 Dizziness and giddiness: Secondary | ICD-10-CM

## 2021-04-20 MED ORDER — TOPIRAMATE 25 MG PO TAB
50 mg | ORAL_TABLET | Freq: Two times a day (BID) | ORAL | 0 refills | Status: DC
Start: 2021-04-20 — End: 2021-04-20

## 2021-04-20 MED ORDER — TOPIRAMATE 25 MG PO TAB
ORAL_TABLET | Freq: Two times a day (BID) | 0 refills | Status: AC
Start: 2021-04-20 — End: ?

## 2021-04-30 ENCOUNTER — Encounter: Admit: 2021-04-30 | Discharge: 2021-04-30 | Payer: BC Managed Care – PPO

## 2021-04-30 ENCOUNTER — Ambulatory Visit: Admit: 2021-04-30 | Discharge: 2021-04-30 | Payer: BC Managed Care – PPO

## 2021-04-30 DIAGNOSIS — H33311 Horseshoe tear of retina without detachment, right eye: Secondary | ICD-10-CM

## 2021-04-30 DIAGNOSIS — H04123 Dry eye syndrome of bilateral lacrimal glands: Secondary | ICD-10-CM

## 2021-04-30 DIAGNOSIS — G43909 Migraine, unspecified, not intractable, without status migrainosus: Secondary | ICD-10-CM

## 2021-04-30 DIAGNOSIS — H527 Unspecified disorder of refraction: Secondary | ICD-10-CM

## 2021-04-30 DIAGNOSIS — B399 Histoplasmosis, unspecified: Secondary | ICD-10-CM

## 2021-04-30 DIAGNOSIS — Z961 Presence of intraocular lens: Secondary | ICD-10-CM

## 2021-04-30 DIAGNOSIS — H547 Unspecified visual loss: Secondary | ICD-10-CM

## 2021-04-30 DIAGNOSIS — H40042 Steroid responder, left eye: Secondary | ICD-10-CM

## 2021-04-30 NOTE — Progress Notes
Body mass index is 28.89 kg/m.      OCT:  OD: Preserved foveal contour with distinguished retinal layers.  OS: Preserved foveal contour with distinguished retinal layers.    Optos:  OD: No retinal tear, no retinal detachment, no vitreous hemorrhage, retina all attached.  OS: No retinal tear, no retinal detachment, no vitreous hemorrhage, retina all attached.         Assessment and Plan:         HX of steroids shot in OS 02/2018 with increased IOP since then  S/p STK removal by Dr.Alapati 01/04/2018 due to high IOP    Hx of anterior ischemic optic neuropathy (NAION) OS  Carotid doppler showed minima plpaque, mild atherosclerosis (<50% stenosis) 12/2016  MRI brain: few foci of white matter signal abnormality, could be related to microvascular ischemic changes, demyelinating disease, trauma, migraine, or other etiologies.       1-Anisocoria OS  Seen with Dr.Whittaker  Reports unremarkable [facility] at [facility]      2- Hx of retinalvasculitis, bilateral  Unclear etiology. Pt had extensive w/u in [place].  Was started on prednisone 60mg  but was not enough so STK was done OS 02/2017  No vasculitis seen on FA (11/2017)  Retinal hge nasal to [facility] on previous imaging  Need to get records from [facility] with unremarkable work up    3- Retinal tear OD  Sup-temp  Complete surrounding pigmentation  S/pprophylactic laser ttt    4- Steroid responder, left  IOP OS today=16mmHg  S/p glaucoma surgery  Now sees Dr.Bray    5- Refractive error OU  Referral to optometry in [place]    6- Dry eye syndrome Ou  ATs and compresses (sample provided)  Now sees Dr.Bray    Signs and symptoms of retinal tears and retinal detachment were reviewed in details with the patient. Patient was instructed to immediately present for evaluation or seek medical help if increased flashes, increased floaters, any decrease in vision, or curtain in field of vision.    F/u Dr.Bray  Retina prn

## 2021-05-01 NOTE — Progress Notes
All issues documented were discussed and addressed but no physical exam was performed unless allowed by visual confirmation on Zoom. If it was felt that the patient should be evaluated in clinic then they were directed there. Patient verbally consented to visit and understands diagnostic limitations of not having ability to complete full physical examination.     CC: Migraine Follow Up       History of Present Illness  Brooke Zuniga is a 46 y.o. female  who is a patient of Dr. Verna Czech who presents over Telehealth today for routine follow up on migraines. Please see below for any updates or changes to current migraine characteristics.       Onset: 20's  Location: base of the skull, other times behind her eyes -same  Quality: pressure -same  Severity: starts at 2-3/10 and as it progresses it turns into 8-9/10  Average number of headache days per month: 1-2 migraines since last visit in November.   Duration: 24 hours  Aggravating symptoms: nausea, vomiting, photophobia, phonophobia, dizziness--> most bothersome symptom. This has been progressively worsening.   Dizziness is nearly gone since restarting Topiramate!  Denies: lacrimation/rhinorrhea, positional changes, ringing/roaring of the ears  Triggers: unable to identify -same  Aura: denies  Last Eye Exam: 04/30/21 with [facility]- normal.    HX of steroids shot in OS 02/2018 with increased IOP.  Hx of anterior ischemic optic neuropathy.- Hx of retinal?vasculitis, bilateral  Unclear etiology    She did see an ENT and inner ear issues were ruled out.    Current treatments:  Topiramate 50mg  BID (Restarted in November 2022 )-   Rizatriptan 5mg  PRN- dulls the intensity    Prior treatments:  imitrex- made her feel like a zombie  Propanolol- worsened asthma     Vestibular testing in 2019 which was reported normal.       Previous Work Up/Imaging:  MRI from 2018 was reviewed and showed patchy FLAIR hyperintensities.  MRI from 2020 again showed scattered FLAIR hyperintensities, most consistent with migraine.  MRA head from 2020 showed a possible 1 to 2 mm aneurysm along the posterior proximal left P1 segment.  CTA head from December 2020 showed no intracranial aneurysm.  Felt the initial aneurysm was artifactual.     Medical History:   Diagnosis Date   ? Histoplasmosis 2008   ? Migraines    ? Vision decreased      Surgical History:   Procedure Laterality Date   ? DILATION AND CURETTAGE  2000   ? DILATION AND CURETTAGE  2001   ? PROPHYLAXIS RETINAL DETACHMENT - 1 OR MORE SESSION - PHOTOCOAGULATION Right 08/05/2017    Performed by Ronnald Collum, MD at [facility] OR   ? REMOVAL FOREIGN BODY EXTERNAL EYE - CONJUNCTIVAL SUPERFICIAL Left 01/04/2018    Performed by Lorel Monaco, MD at [facility] OR   ? MANUAL/ MECHANICAL EXTRACAPSULAR CATARACT REMOVAL WITH INSERTION INTRAOCULAR LENS PROSTHESIS - 1 STAGE Left 03/01/2018    Performed by Lorel Monaco, MD at [facility] OR   ? FISTULIZATION SCLERA/ TRABULECTOMY AB EXTERNO IN ABSENCE OF PREVIOUS SURGERY Left 03/01/2018    Performed by Lorel Monaco, MD at [facility] OR   ? EAR TUBES     ? HX HYSTERECTOMY     ? LASIK Bilateral     unsuccessful   ? LYMPH NODE DISSECTION     ? VARICOSE VEIN SURGERY       Social History     Socioeconomic History   ? Marital status:  Married   Tobacco Use   ? Smoking status: Some Days     Types: Cigars   ? Smokeless tobacco: Never   ? Tobacco comments:     cigars once in a while   Vaping Use   ? Vaping Use: Never used   Substance and Sexual Activity   ? Alcohol use: Yes     Comment: occaisonally    ? Drug use: Never     Family History   Problem Relation Age of Onset   ? Cataract Father    ? Diabetes Father    ? Stroke Father    ? Cancer Mother    ? Migraines Mother    ? Migraines Sister    ? Diabetes Maternal Grandmother    ? Diabetes Maternal Grandfather    ? Diabetes Paternal Grandmother    ? Stroke Paternal Grandmother    ? Diabetes Paternal Grandfather    ? Cancer Maternal Aunt    ? Migraines Maternal Aunt    ? Neuropathy Maternal Aunt    ? Glaucoma Neg Hx    ? Macular Degen Neg Hx        Review of Systems   Eyes: Positive for photophobia.   Neurological: Positive for dizziness and headaches.     Objective:         ? coenzyme Q10 100 mg cap Take one capsule by mouth.   ? cyanocobalamin (RUBRAMIN) 1,000 mcg/mL injection Inject 1 mL into the muscle every 30 days.   ? ergocalciferol (vitamin D2) (VITAMIN D PO) Take  by mouth.   ? levalbuterol tartrate(+) (XOPENEX HFA) 45 mcg/actuation inhaler Inhale two puffs by mouth into the lungs every 4-6 hours as needed for Wheezing or Shortness of Breath.   ? methylphenidate HCL (CONCERTA) 18 mg CR tablet Take one tablet by mouth daily.   ? riboflavin (VITAMIN B-2) 100 mg tab Take one tablet by mouth daily.   ? rizatriptan (MAXALT-MLT) 10 mg rapid dissolve tablet Take one tablet by mouth at onset of headache. May repeat after 2 hours. Max of 30 mg in 24 hours.  Indications: a migraine headache   ? topiramate (TOPAMAX) 50 mg tablet Take one tablet by mouth every 12 hours. Indications: migraine prevention     There were no vitals filed for this visit.  There is no height or weight on file to calculate BMI.     Physical Exam    Alert and in no distress.  Converses and answers questions appropriately.  Speech is normal without dysarthria.  Face is symmetric with normal movements.       Assessment and Plan:    Impression:  1. Chronic migraine without aura. Recently worsened over the last 3 weeks after stopping Topiramate. She is back to experiencing dizziness on a daily basis.   2. Migraine with associated vertigo.    Plan:  1. Continue Topiramate 50mg  twice daily for migraine prevention.  --Take folic acid 1mg  daily and Calcium/Vitamin D while taking this medication. These are found over the counter.   --Please note side effects include, but are not limited to numbness, tingling, kidney stones, alteration in taste, weight loss. There is also slight risk for vision changes. If you have significant vision changes while taking this medication, please see an ophthalmologist right away. This can rarely cause some cognitive slowing (mainly with word finding) at higher doses (usually >200mg /day). If you have any mood changes/suicidal thoughts, stop medication right away and give [place] a call.   --  Please remember that this medication can cause birth defects, so you should not become pregnant while taking this. Also, higher doses (usually >200mg /day) can cause some birth control to be ineffective, so please use a back up method.    2. Increased Rizatriptan from 5mg  to 10mg  for severe migraines. If symptoms do not improve after 2 hours, you can take another tablet. No more than 2 tablets in 24 hours. 9 tablets=30 day supply.    4. Follow up in 1 year or sooner if symptoms worsen.     Total of 22 minutes were spent on the same day of the visit including preparing to see the patient, obtaining   and/or reviewing separately obtained history, performing a medically appropriate examination and/or   evaluation, counseling and educating the patient/family/caregiver, ordering medications, tests, or   procedures, referring and communication with other health care professionals, documenting clinical   information in the electronic or other health record, independently interpreting results and communicating   results to the patient/family/caregiver, and care coordination.

## 2021-05-06 ENCOUNTER — Ambulatory Visit: Admit: 2021-05-06 | Discharge: 2021-05-07 | Payer: BC Managed Care – PPO

## 2021-05-06 ENCOUNTER — Encounter: Admit: 2021-05-06 | Discharge: 2021-05-06 | Payer: BC Managed Care – PPO

## 2021-05-06 DIAGNOSIS — G43709 Chronic migraine without aura, not intractable, without status migrainosus: Secondary | ICD-10-CM

## 2021-05-06 DIAGNOSIS — H547 Unspecified visual loss: Secondary | ICD-10-CM

## 2021-05-06 DIAGNOSIS — G43909 Migraine, unspecified, not intractable, without status migrainosus: Secondary | ICD-10-CM

## 2021-05-06 DIAGNOSIS — B399 Histoplasmosis, unspecified: Secondary | ICD-10-CM

## 2021-05-06 MED ORDER — RIZATRIPTAN 10 MG PO TBDI
ORAL_TABLET | 11 refills | Status: AC
Start: 2021-05-06 — End: ?

## 2021-05-06 MED ORDER — TOPIRAMATE 50 MG PO TAB
50 mg | ORAL_TABLET | Freq: Two times a day (BID) | ORAL | 3 refills | Status: AC
Start: 2021-05-06 — End: ?

## 2021-05-26 ENCOUNTER — Encounter: Admit: 2021-05-26 | Discharge: 2021-05-26 | Payer: BC Managed Care – PPO

## 2021-12-23 ENCOUNTER — Encounter: Admit: 2021-12-23 | Discharge: 2021-12-23 | Payer: BC Managed Care – PPO

## 2022-04-05 ENCOUNTER — Encounter: Admit: 2022-04-05 | Discharge: 2022-04-05 | Payer: BC Managed Care – PPO

## 2022-05-04 NOTE — Progress Notes
All issues documented were discussed and addressed but no physical exam was performed unless allowed by visual confirmation on Zoom. If it was felt that the patient should be evaluated in clinic then they were directed there. Patient verbally consented to visit and understands diagnostic limitations of not having ability to complete full physical examination.     CC: Migraine Follow Up       History of Present Illness  Brooke Zuniga is a 47 y.o. female  who is a patient of Dr. Verna Czech who presents over telehealth today for routine follow up on migraines. Please see below for any updates or changes to current migraine characteristics.       Onset: 20's  Location: base of the skull, other times behind her eyes -same  Quality: pressure -same  Severity: starts at 2-3/10 and as it progresses it turns into 8-9/10 --> on average they are a 3-4/10, when severe they are a 10/10  Average number of headache days per month: 1-2 migraines since last visit in November --> Brooke Zuniga is reporting 3-4 headaches over the last 3 months. She can not remember monthly because they are at random.   Duration: 24 hours --> Last for 1-2 hours but she has a hang over effect for a month. Abortive therapy works quickly.   Aggravating symptoms: nausea, vomiting, photophobia, phonophobia, dizziness--> most bothersome symptom. This has been progressively worsening.   Dizziness is nearly gone since restarting Topiramate!  Denies: lacrimation/rhinorrhea, positional changes, ringing/roaring of the ears -same   Triggers: unable to identify -same  Aura: denies  Last Eye Exam: 04/30/21 with [facility]- normal.    HX of steroids shot in OS 02/2018 with increased IOP.  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    Current treatments:  Topiramate 50mg  BID (Restarted in November 2022 )- Working well. Denies side effects.   Rizatriptan 10mg  PRN- dulls the intensity --> Gets rid of the headache. Denies side effects. Just using for a severe migraine, does not know exact amount she uses a month.     Prior treatments:  imitrex- made her feel like a zombie  Propanolol- worsened asthma     Vestibular testing in 2019 which was reported normal.       Previous Work Up/Imaging:  MRI from 2018 was reviewed and showed patchy FLAIR hyperintensities.  MRI from 2020 again showed scattered FLAIR hyperintensities, most consistent with migraine.  MRA head from 2020 showed a possible 1 to 2 mm aneurysm along the posterior proximal left P1 segment.  CTA head from December 2020 showed no intracranial aneurysm.  Felt the initial aneurysm was artifactual.     Medical History:   Diagnosis Date    Histoplasmosis 2008    Migraines     Vision decreased      Surgical History:   Procedure Laterality Date    DILATION AND CURETTAGE  2000    DILATION AND CURETTAGE  2001    PROPHYLAXIS RETINAL DETACHMENT - 1 OR MORE SESSION - PHOTOCOAGULATION Right 08/05/2017    Performed by Ronnald Collum, MD at [facility] OR    REMOVAL FOREIGN BODY EXTERNAL EYE - CONJUNCTIVAL SUPERFICIAL Left 01/04/2018    Performed by Lorel Monaco, MD at [facility] OR    MANUAL/ MECHANICAL EXTRACAPSULAR CATARACT REMOVAL WITH INSERTION INTRAOCULAR LENS PROSTHESIS - 1 STAGE Left 03/01/2018    Performed by Alapati,  Neeti, MD at [facility] OR    FISTULIZATION SCLERA/ TRABULECTOMY AB EXTERNO IN ABSENCE OF PREVIOUS SURGERY Left 03/01/2018    Performed by Lorel Monaco, MD at [facility] OR    EAR TUBES      HX HYSTERECTOMY      LASIK Bilateral     unsuccessful    LYMPH NODE DISSECTION      VARICOSE VEIN SURGERY       Social History     Socioeconomic History    Marital status: Married   Tobacco Use    Smoking status: Some Days     Types: Cigars    Smokeless tobacco: Never    Tobacco comments:     cigars once in a while   Vaping Use    Vaping status: Never Used   Substance and Sexual Activity    Alcohol use: Yes     Comment: occaisonally Drug use: Never     Family History   Problem Relation Age of Onset    Cataract Father     Diabetes Father     Stroke Father     Cancer Mother     Migraines Mother     Migraines Sister     Diabetes Maternal Grandmother     Diabetes Maternal Grandfather     Diabetes Paternal Grandmother     Stroke Paternal Grandmother     Diabetes Paternal Grandfather     Cancer Maternal Aunt     Migraines Maternal Aunt     Neuropathy Maternal Aunt     Glaucoma Neg Hx     Macular Degen Neg Hx        Review of Systems   Eyes:  Positive for photophobia.   Neurological:  Positive for dizziness and headaches.     Objective:          coenzyme Q10 100 mg cap Take one capsule by mouth.    cyanocobalamin (RUBRAMIN) 1,000 mcg/mL injection Inject 1 mL into the muscle every 30 days.    ergocalciferol (vitamin D2) (VITAMIN D PO) Take  by mouth.    riboflavin (VITAMIN B-2) 100 mg tab Take one tablet by mouth daily.    rizatriptan (MAXALT-MLT) 10 mg rapid dissolve tablet Take one tablet by mouth at onset of headache. May repeat after 2 hours. Max of 30 mg in 24 hours.  Indications: a migraine headache    topiramate (TOPAMAX) 50 mg tablet Take one tablet by mouth every 12 hours. Indications: migraine prevention     Vitals:    05/06/22 0915   BP: 123/80   BP Source: Arm, Left Upper   Pulse: 73   SpO2: 99%   PainSc: Zero   Weight: 79.8 kg (176 lb)   Height: 172.7 cm (5' 8)     Body mass index is 26.76 kg/m?Marland Kitchen     Physical Exam    General: alert, oriented x 3  Speech: normal, no dysarthria  Ext: no leg edema  Cranial nerves: ophthalmoscopic exam: no papilledema or optic pallor; II Visual fields full to finger counting; III, IV, VI PERRL, extraocular muscles intact, no nystagmus; V facial sensation intact; VII facial expression symmetric; VIII hearing intact to conversation  Motor: (Right/Left)  Deltoid 5/5, Biceps 5/5, Triceps 5/5, Finger ext 5/5, interossei 5/5, Hip Flexion 5/5, Knee ext 5/5, Knee flex 5/5, Ankle dorsiflexion 5/5, Ankle plantarflexion 5/5  Sensory: Normal to light touch.  Coordination: normal finger nose finger, rapid alternating movements and finger tapping speed normal, and heel to  shin smooth without ataxia  Reflexes: (Right/Left) Biceps 2/2, Triceps 2/2, Brachioradialis 2/2, Knee Jerks 1/1, Ankle Jerks 1/1  Abnormal Movements: no tremors, no rigidity, no bradykinesia  Gait: normal based, good arm swing.         Assessment and Plan:    Impression:  Chronic migraine without aura. Recently worsened over the last 3 weeks after stopping Topiramate. She is back to experiencing dizziness on a daily basis.   Migraine with associated vertigo.    Plan:  1. Continue Topiramate 50mg  twice daily for migraine prevention.  --Take folic acid 1mg  daily and Calcium/Vitamin D while taking this medication. These are found over the counter.   --Please note side effects include, but are not limited to numbness, tingling, kidney stones, alteration in taste, weight loss. There is also slight risk for vision changes. If you have significant vision changes while taking this medication, please see an ophthalmologist right away. This can rarely cause some cognitive slowing (mainly with word finding) at higher doses (usually >200mg /day). If you have any mood changes/suicidal thoughts, stop medication right away and give [place] a call.   --Please remember that this medication can cause birth defects, so you should not become pregnant while taking this. Also, higher doses (usually >200mg /day) can cause some birth control to be ineffective, so please use a back up method.  2. Referral to Dr. Seleta Rhymes for ongoing management of chronic migraines.  3. Continue Rizatriptan 10mg  for abortive migraine therapy. May repeat dose 2 hours later if symptoms do not improve. Max of 2 tablets in 24 hours. 9 tablets=30 day supply.     For Acute Headache:  -- For a more severe headache, take Rizatriptan 10mg + naproxen 440/500mg  + magnesium oxide 400-500mg  +/- benadryl 25mg  (depending on if you are at work/needing to drive)  -- Please refrain from taking any combination of as needed medications more than 8-10 times per month, as this can lead to medication overuse/rebound headaches.      Total of 30 minutes were spent on the same day of the visit including preparing to see the patient, obtaining   and/or reviewing separately obtained history, performing a medically appropriate examination and/or   evaluation, counseling and educating the patient/family/caregiver, ordering medications, tests, or   procedures, referring and communication with other health care professionals, documenting clinical   information in the electronic or other health record, independently interpreting results and communicating   results to the patient/family/caregiver, and care coordination.

## 2022-05-06 ENCOUNTER — Encounter: Admit: 2022-05-06 | Discharge: 2022-05-06 | Payer: BC Managed Care – PPO

## 2022-05-06 ENCOUNTER — Ambulatory Visit: Admit: 2022-05-06 | Discharge: 2022-05-07 | Payer: BC Managed Care – PPO

## 2022-05-06 DIAGNOSIS — H547 Unspecified visual loss: Secondary | ICD-10-CM

## 2022-05-06 DIAGNOSIS — G43709 Chronic migraine without aura, not intractable, without status migrainosus: Secondary | ICD-10-CM

## 2022-05-06 DIAGNOSIS — G43909 Migraine, unspecified, not intractable, without status migrainosus: Secondary | ICD-10-CM

## 2022-05-06 DIAGNOSIS — B399 Histoplasmosis, unspecified: Secondary | ICD-10-CM

## 2022-05-06 MED ORDER — TOPIRAMATE 50 MG PO TAB
50 mg | ORAL_TABLET | Freq: Two times a day (BID) | ORAL | 3 refills | Status: AC
Start: 2022-05-06 — End: ?

## 2022-05-06 NOTE — Progress Notes
The MIDAS (Migraine Disability Assessment) questionnaire was put together to help you measure the impact your headaches have on your life. The information on this questionnaire is also helpful for your provider to determine the level of pain and disability caused by your headaches and to find the best treatment for you. This information may also be required by your insurance for authorization for treatments ordered by your provider.      INSTRUCTIONS:     Please answer the following questions about ALL of the headaches you have had over the last 3 months. If necessary, answer the question with your average per month and multiply by 3.  Select zero if you did not have the activity (ex: if you do not work or go to school) in the last 3 months.     0 1. How many days in the last 3 months did you miss work or school because of your headaches?    0 2. How many days in the last 3 months was your productivity at work or school reduced by half or more because of your headaches? (Do not include days you counted in question 1 where you missed work or school.)     0 3. How many days in the last 3 months did you not do household work because of your headaches?    1 4. How many days in the last 3 months was your productivity in household work reduced by half or more because of your headaches? (Do not include days you counted in question 3 where you did not do household work.)    1 5. How many days in the last 3 months did you miss family, social or leisure activities because of your headaches?    2 Total (Questions 1-5)     What your Physician will need to know about your headache:    5 A. On how many days in the last 3 months did you have a headache? (If a headache lasted more than 1 day, count each day.)    10 B. On a scale of 0 - 10, on average how painful were these headaches? (where 0=no pain at all, and 10= pain as bad as it can be.)

## 2022-05-11 ENCOUNTER — Ambulatory Visit: Admit: 2022-05-11 | Discharge: 2022-05-11 | Payer: BC Managed Care – PPO

## 2022-05-11 ENCOUNTER — Encounter: Admit: 2022-05-11 | Discharge: 2022-05-11 | Payer: BC Managed Care – PPO

## 2022-05-11 DIAGNOSIS — H547 Unspecified visual loss: Secondary | ICD-10-CM

## 2022-05-11 DIAGNOSIS — B399 Histoplasmosis, unspecified: Secondary | ICD-10-CM

## 2022-05-11 DIAGNOSIS — H527 Unspecified disorder of refraction: Secondary | ICD-10-CM

## 2022-05-11 DIAGNOSIS — Z961 Presence of intraocular lens: Secondary | ICD-10-CM

## 2022-05-11 DIAGNOSIS — H33311 Horseshoe tear of retina without detachment, right eye: Secondary | ICD-10-CM

## 2022-05-11 DIAGNOSIS — H04123 Dry eye syndrome of bilateral lacrimal glands: Secondary | ICD-10-CM

## 2022-05-11 DIAGNOSIS — H40042 Steroid responder, left eye: Secondary | ICD-10-CM

## 2022-05-11 DIAGNOSIS — G43909 Migraine, unspecified, not intractable, without status migrainosus: Secondary | ICD-10-CM

## 2022-05-11 NOTE — Progress Notes
Body mass index is 26.76 kg/m.      OCT:  OD: Preserved foveal contour with distinguished retinal layers.  OS: Preserved foveal contour with distinguished retinal layers.          Assessment and Plan:           HX of steroids shot in OS 02/2018 with increased IOP since then  S/p STK removal by Dr.Alapati 01/04/2018 due to high IOP     Hx of anterior ischemic optic neuropathy (NAION) OS  Carotid doppler showed minima plpaque, mild atherosclerosis (<50% stenosis) 12/2016  MRI brain: few foci of white matter signal abnormality, could be related to microvascular ischemic changes, demyelinating disease, trauma, migraine, or other etiologies.     - Hx of retinal vasculitis, bilateral. Unclear etiology. Pt had extensive w/u in [place]. Pending records from [facility] with unremarkable work up    Hx of Steroid responder, left    1- Anisocoria OS  Seen with Dr.Whittaker  Reports unremarkable MRI at [facility]       2- Retinal tear OD  Sup-temp  S/p  prophylactic laser ttt     3- Refractive error OU  Referral  to optometry in [place]     4- Dry eye syndrome OU  ATs and compresses (sample provided)  Now sees Dr.Bray     Signs and symptoms of retinal tears and retinal detachment were reviewed in details with the patient. Patient was instructed to immediately present for evaluation or seek medical help if increased flashes, increased floaters, any decrease in vision, or curtain in field of vision.     F/u Dr.Ambrose  Retina prn

## 2022-06-01 ENCOUNTER — Encounter: Admit: 2022-06-01 | Discharge: 2022-06-01 | Payer: BC Managed Care – PPO

## 2022-06-01 ENCOUNTER — Ambulatory Visit: Admit: 2022-06-01 | Discharge: 2022-06-01 | Payer: BC Managed Care – PPO

## 2022-06-01 DIAGNOSIS — H04123 Dry eye syndrome of bilateral lacrimal glands: Secondary | ICD-10-CM

## 2022-06-01 DIAGNOSIS — H33311 Horseshoe tear of retina without detachment, right eye: Secondary | ICD-10-CM

## 2022-06-01 DIAGNOSIS — H40042 Steroid responder, left eye: Secondary | ICD-10-CM

## 2022-06-01 NOTE — Assessment & Plan Note
Discussed to use tears as needed for dry eye

## 2022-06-01 NOTE — Assessment & Plan Note
S/p laser retinopexy with Dr. Miguel Aschoff.  Dilate next visit

## 2022-06-01 NOTE — Progress Notes
Glaucoma History  -FH +  -Tmax:   -PMH Asthma,  OSA,  migraines  -Trauma   -CCT 514/491  -Steroid use   Previous Surgery/lasers  Trab OS for steroid responder  Medications  Allergies:   Allergies   Allergen Reactions    Ceclor [Cefaclor] RASH     Anaphylaxis    Prednisone SEE COMMENTS     Drops: tears as needed  Goal IOP: determine         Assessment and Plan:    Problem   Retinal Tear of Right Eye   Dry Eye Syndrome of Both Eyes   Steroid Responder, Left Eye       Steroid responder, left eye  IOP excellent today OU off IOP drops  Trab working well OS  Discussed red eye precautions  HVF reduced sensitivity, no obvious glaucomatous damage OS  DFE OCT n and m next visit      Dry eye syndrome of both eyes  Discussed to use tears as needed for dry eye    Retinal tear of right eye  S/p laser retinopexy with Dr. Elpidio Anis.  Dilate next visit            NEXT VISIT   DFE and OCT - n and GCL 6 months            [name], MD   [place] [facility]      HPI:  Patient presents with:  Eye Problem: NP glaucoma evaluation per Dr. Elpidio Anis, previously seen by Dr. Jason Fila; pt reports stable vision. Denies any eye pain.  Not using any gtts currently.         Exam:  Base Eye Exam       Visual Acuity (Snellen - Linear)         Right Left    Dist cc 20/20 20/20      Correction: Glasses              Tonometry (iCare Tonometer, 9:13 AM)         Right Left    Pressure 11 9              Tonometry #2 (Applanation, 9:26 AM)         Right Left    Pressure 15 10              Gonioscopy         Right Left    Temporal D45r patle tm D45r patle tm    Nasal D45r patle tm D45r patle tm    Superior D45r patle tm D45r patle tm optne ostomy    Inferior D45r patle tm D45r patle tm              Pupils         Dark Shape React APD    Right 4 Round + None    Left 4 Round + None              Visual Fields    HVF 24-2 done today             Neuro/Psych       Oriented x3: Yes    Mood/Affect: Normal                  Slit Lamp and Fundus Exam External Exam         Right Left    External Normal Normal  Slit Lamp Exam         Right Left    Lids/Lashes Normal Normal    Conjunctiva/Sclera White and quiet sup bleb low diffuse, centrally cystic    Cornea Clear Clear    Anterior Chamber Deep and quiet Deep and quiet    Iris Flat patent superior PI    Lens Clear Posterior chamber intraocular lens, trc inferior PCO not in visual axis    Anterior Vitreous Normal Normal              Fundus Exam         Right Left    Disc Sharp, healthy rim, very full nerves, no obscurations of vessels sharp, healthy rim, full nerves, no obvscuration of vessels    C/D Ratio 0.1 0.2                    VISUAL FIELD, EXTEND          Humphrey Automated exam type was used.     Right Eye  Threshold was 24-2. Strategy was SITA Standard. Reliability was poor. Progression has been stable. Non-Specific Defects     Left Eye  Threshold was 24-2. Strategy was SITA Standard. Reliability was borderline. Progression has worsened.     Notes  OS: generalized reduced sensitivity         GONIOSCOPY          Gonioscopy OU: See clinic note

## 2022-06-01 NOTE — Assessment & Plan Note
IOP excellent today OU off IOP drops  Trab working well OS  Discussed red eye precautions  HVF reduced sensitivity, no obvious glaucomatous damage OS  DFE OCT n and m next visit

## 2022-09-02 ENCOUNTER — Encounter: Admit: 2022-09-02 | Discharge: 2022-09-02 | Payer: BC Managed Care – PPO

## 2022-09-02 DIAGNOSIS — G43709 Chronic migraine without aura, not intractable, without status migrainosus: Secondary | ICD-10-CM

## 2022-09-02 MED ORDER — RIZATRIPTAN 10 MG PO TBDI
ORAL_TABLET | 11 refills | Status: AC
Start: 2022-09-02 — End: ?

## 2022-11-04 IMAGING — CR [ID]
2 series · 2 of 2 positions shown · non-contrast
Comparison: none

[x hand pa right]
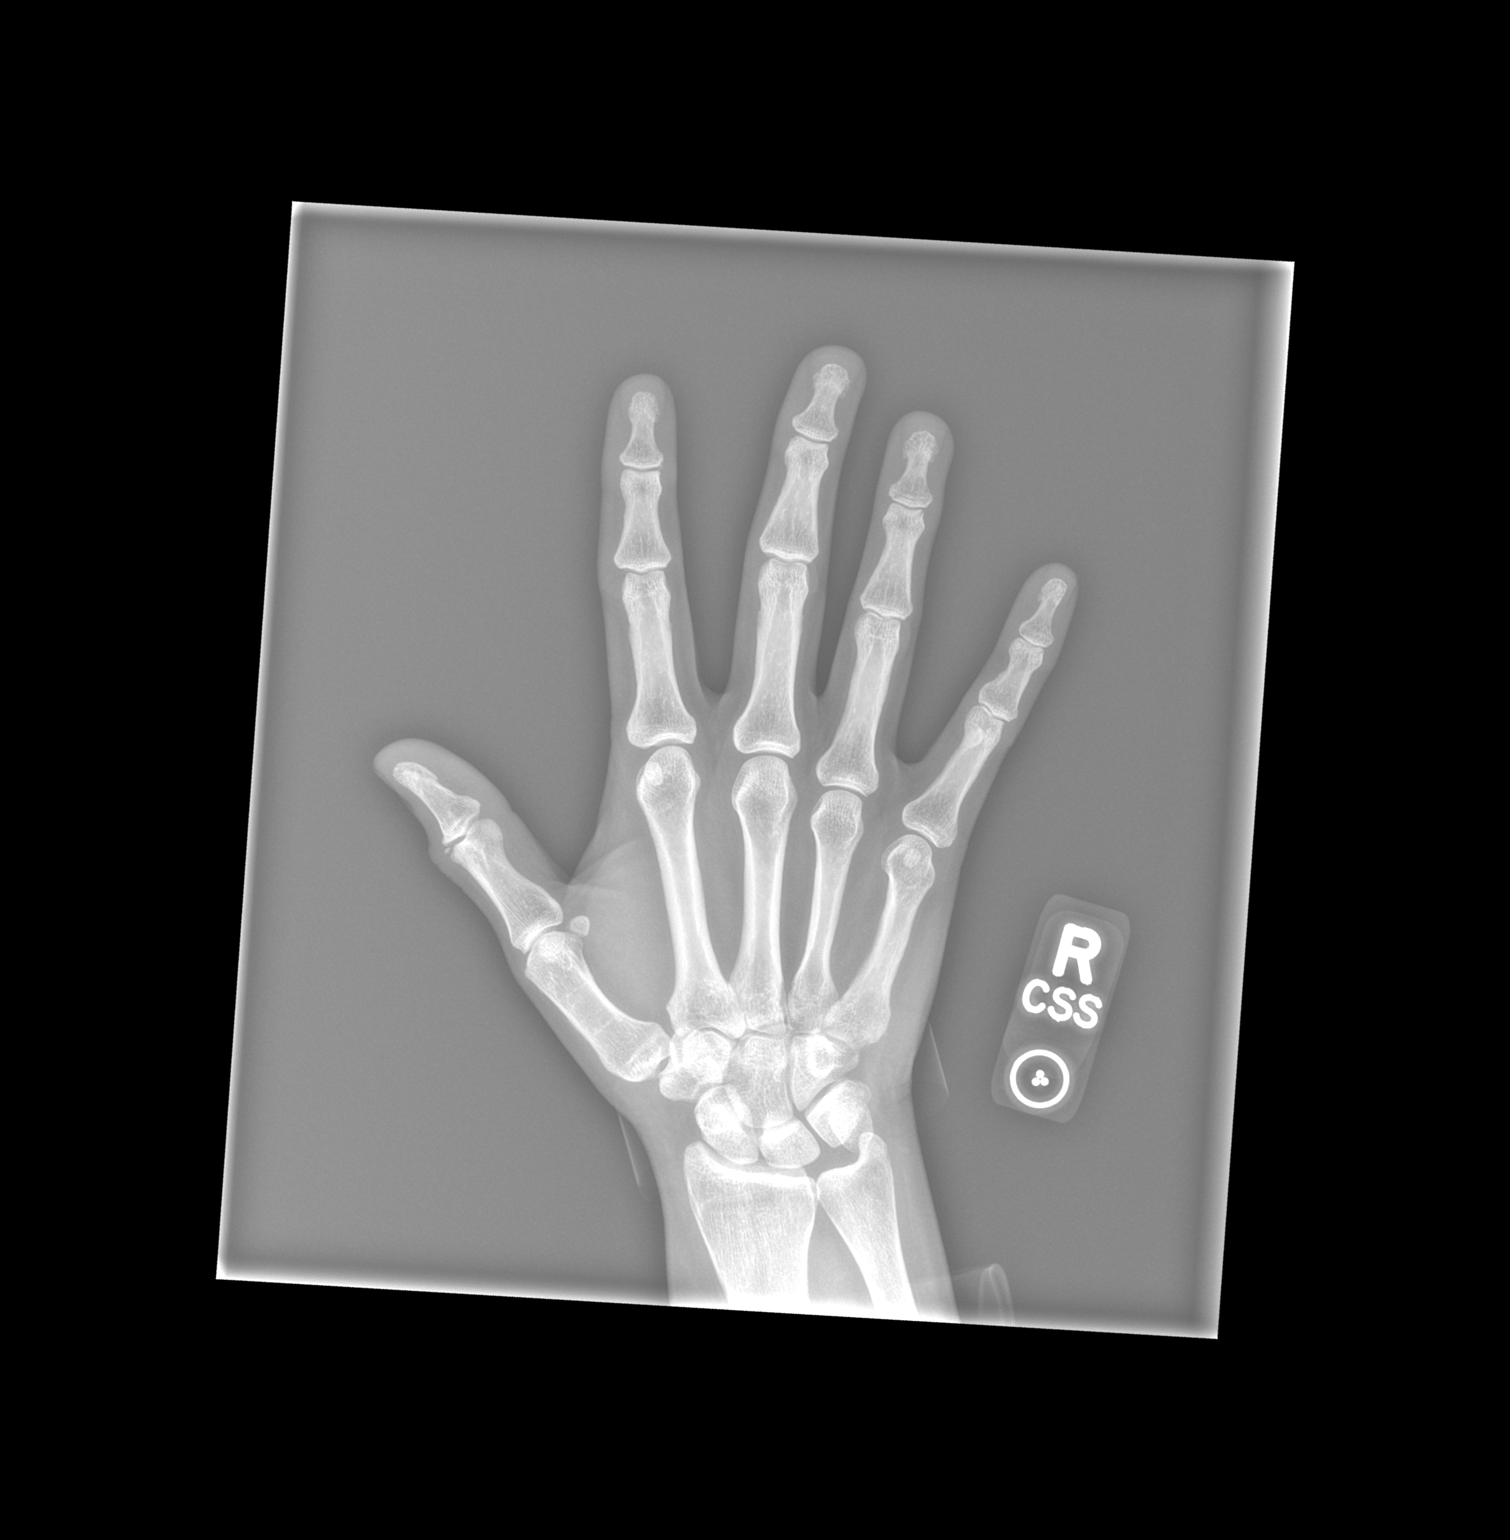

[x hand obl right]
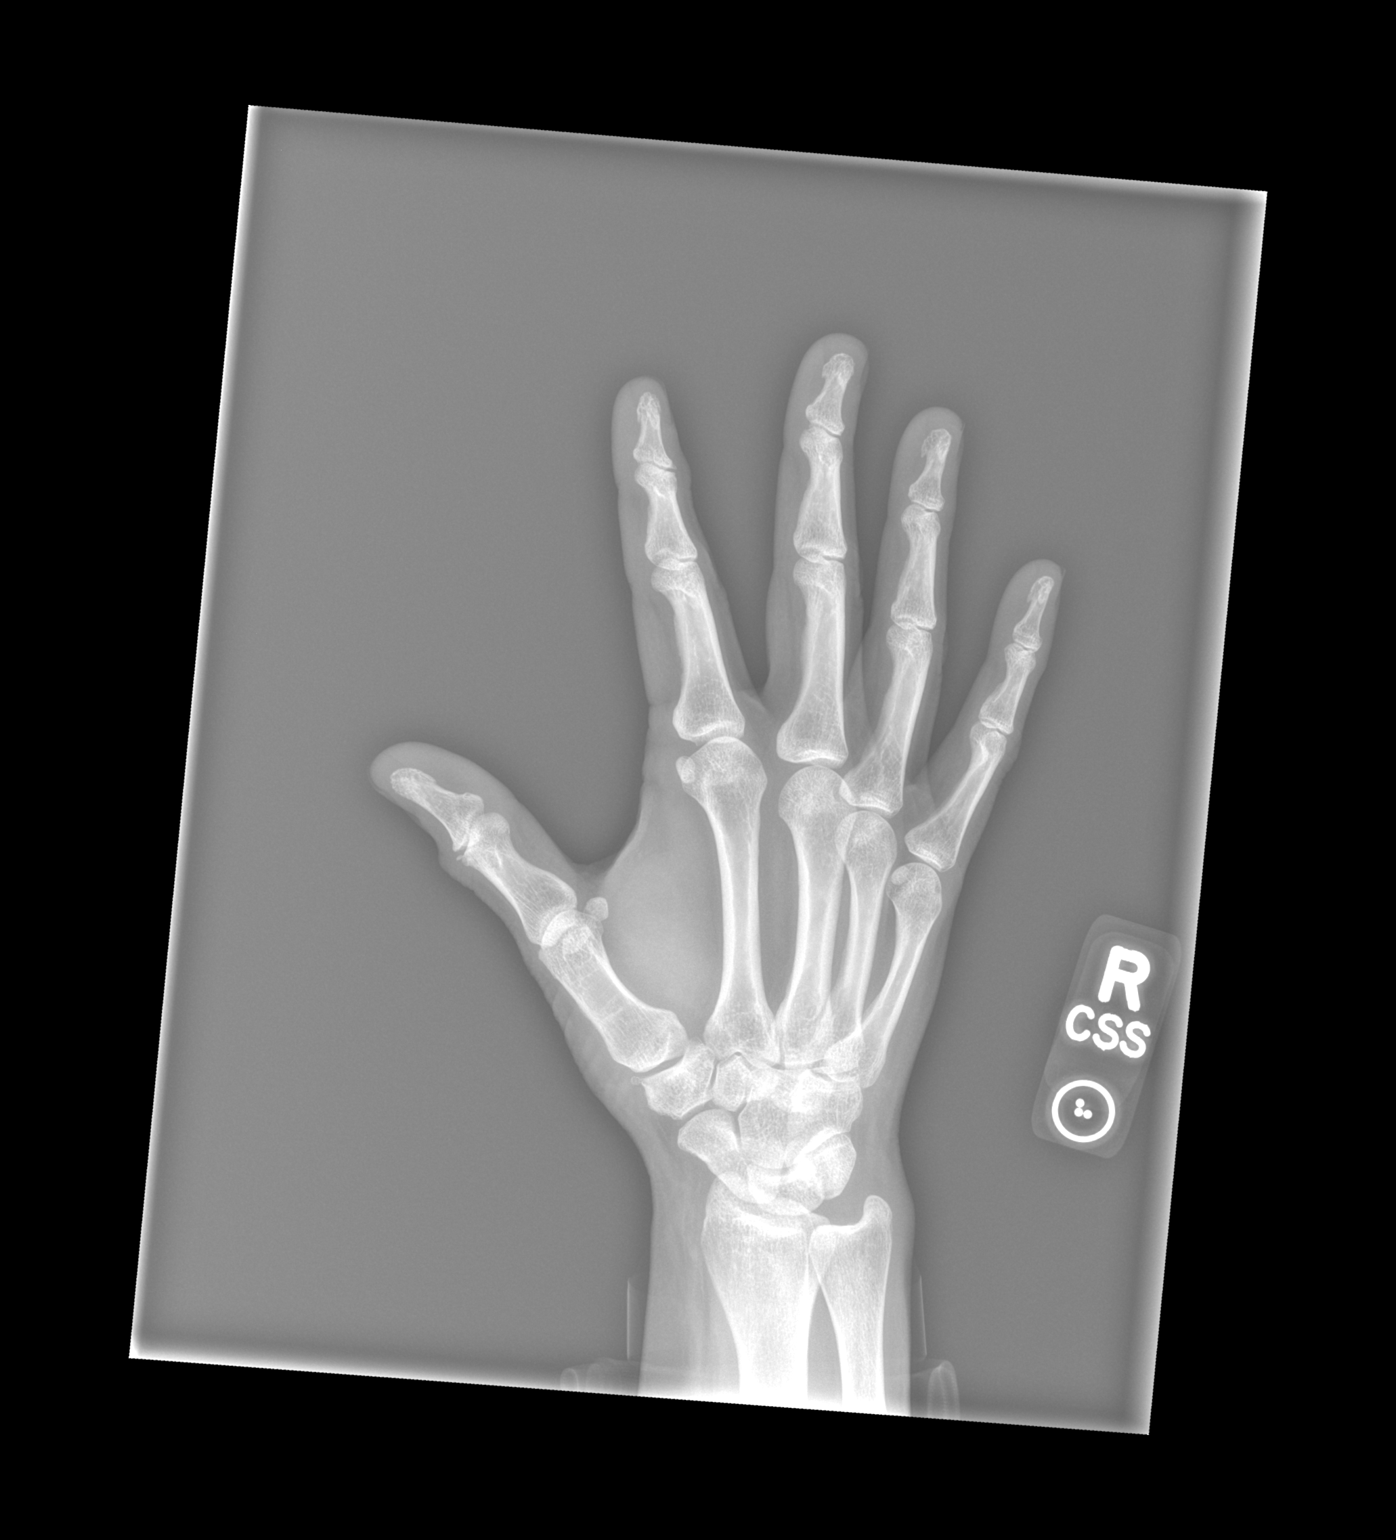

[2 of 2 positions shown; findings below may reference images not displayed]

DIAGNOSTIC STUDIES

EXAM

XR hand RT min 3V

INDICATION

chronic right hand and weakness
Right thumb has a bump that has been present x a few months. Pain in the thumb now. Pain at the 4th
and 5th DIP joint. No known injury. CS

TECHNIQUE

AP lateral and oblique views

COMPARISONS

None available

FINDINGS

No fractures or dislocations are seen. Small ossific density projects along the radial aspect of
the DIP joint of the right thumb which probably is dystrophic. Clinical evaluation to exclude recent
trauma and pain in this location is recommended.

IMPRESSION

Small 2 millimeter probable dystrophic calcification adjacent to the DIP joint of the thumb.
Correlation with physical exam is recommended to exclude recent trauma and acute pain.

Tech Notes:

Right thumb has a bump that has been present x a few months. Pain in the thumb now. Pain at the 4th
and 5th DIP joint. No known injury. CS

## 2022-12-03 ENCOUNTER — Encounter: Admit: 2022-12-03 | Discharge: 2022-12-03 | Payer: BC Managed Care – PPO

## 2022-12-03 ENCOUNTER — Ambulatory Visit: Admit: 2022-12-03 | Discharge: 2022-12-03 | Payer: BC Managed Care – PPO

## 2022-12-03 DIAGNOSIS — H40042 Steroid responder, left eye: Secondary | ICD-10-CM

## 2022-12-03 DIAGNOSIS — H04123 Dry eye syndrome of bilateral lacrimal glands: Secondary | ICD-10-CM

## 2022-12-03 DIAGNOSIS — H547 Unspecified visual loss: Secondary | ICD-10-CM

## 2022-12-03 DIAGNOSIS — B399 Histoplasmosis, unspecified: Secondary | ICD-10-CM

## 2022-12-03 DIAGNOSIS — G43909 Migraine, unspecified, not intractable, without status migrainosus: Secondary | ICD-10-CM

## 2022-12-03 NOTE — Progress Notes
Glaucoma History  -FH +  -Tmax:   -PMH Asthma,  OSA,  migraines  -Trauma   -CCT 514/491  -Steroid use   Previous Surgery/lasers  Trab OS for steroid responder  Medications  Allergies:   Allergies   Allergen Reactions    Ceclor [Cefaclor] RASH     Anaphylaxis    Prednisone SEE COMMENTS     Drops: tears as needed  Goal IOP: determine         Assessment and Plan:    Problem   Dry Eye Syndrome of Both Eyes   Steroid Responder, Left Eye         Dry eye syndrome of both eyes  Ats BID  Discussed red eye precautions    Steroid responder, left eye  Stable exam, good IOP off drops  Good bleb, discussed red eye precautions  Move to yearly exams.  Discussed if she uses steroids to notify us             NEXT VISIT   HVF 24-2 SITA Standard, DFE, and OCT - n and GCL 1 year            Brooke Bonds, MD   [place] [facility]      HPI:  Patient presents with:  Eye Exam: 6 month FUV.  Patient denies VA changes OU and denies eye pain, itching, redness and watering.  Not on any gtts.        Exam:  Base Eye Exam       Visual Acuity (Snellen - Linear)         Right Left    Dist cc 20/20 20/20 -2      Correction: Glasses              Tonometry (iCare Tonometer, 1:02 PM)         Right Left    Pressure 12 8              Tonometry #2 (Applanation, 1:14 PM)         Right Left    Pressure 11 10              Pupils         Pupils Dark Shape APD    Right PERRL 4 Round None    Left PERRL 4 Round None              Neuro/Psych       Oriented x3: Yes    Mood/Affect: Normal              Dilation       Both eyes: 1.0% Tropicamide, 2.5% Phenylephrine @ 1:03 PM                  Slit Lamp and Fundus Exam       External Exam         Right Left    External Normal Normal              Slit Lamp Exam         Right Left    Lids/Lashes Normal Normal    Conjunctiva/Sclera White and quiet sup bleb low diffuse, centrally cystic    Cornea Clear Clear    Anterior Chamber Deep and quiet Deep and quiet    Iris Flat patent superior PI    Lens Clear Posterior chamber intraocular lens, trc inferior PCO not in visual axis    Anterior Vitreous Normal Normal  Fundus Exam         Right Left    Disc Sharp, healthy rim, very full nerves, no obscurations of vessels sharp, healthy rim, full nerves, no obvscuration of vessels    C/D Ratio 0.1 0.2    Macula Flat Flat    Vessels Normal caliber and number Normal caliber and number    Periphery Attached no breaks or tears Attached no breaks or tears                    OCT OPTIC NERVE          Optic Nerve  Right Eye  Optic nerve findings: Normal Progression has been stable.     Left Eye  Findings location Superior Optic nerve findings: Abnormal Thinning Progression has been stable.

## 2023-04-21 ENCOUNTER — Encounter: Admit: 2023-04-21 | Discharge: 2023-04-21 | Payer: BC Managed Care – PPO

## 2023-06-27 ENCOUNTER — Encounter: Admit: 2023-06-27 | Discharge: 2023-06-27 | Payer: BLUE CROSS/BLUE SHIELD

## 2023-06-28 ENCOUNTER — Encounter: Admit: 2023-06-28 | Discharge: 2023-06-28 | Payer: BLUE CROSS/BLUE SHIELD

## 2023-06-28 ENCOUNTER — Ambulatory Visit: Admit: 2023-06-28 | Discharge: 2023-06-29 | Payer: BLUE CROSS/BLUE SHIELD

## 2023-12-15 ENCOUNTER — Encounter: Admit: 2023-12-15 | Discharge: 2023-12-15 | Payer: BLUE CROSS/BLUE SHIELD

## 2024-01-05 ENCOUNTER — Ambulatory Visit: Admit: 2024-01-05 | Discharge: 2024-01-06 | Payer: BLUE CROSS/BLUE SHIELD

## 2024-01-05 ENCOUNTER — Ambulatory Visit: Admit: 2024-01-05 | Discharge: 2024-01-05 | Payer: BLUE CROSS/BLUE SHIELD

## 2024-01-05 ENCOUNTER — Encounter: Admit: 2024-01-05 | Discharge: 2024-01-05 | Payer: BLUE CROSS/BLUE SHIELD

## 2024-01-05 DIAGNOSIS — H35063 Retinal vasculitis, bilateral: Secondary | ICD-10-CM

## 2024-01-05 DIAGNOSIS — H40042 Steroid responder, left eye: Principal | ICD-10-CM

## 2024-01-05 DIAGNOSIS — H04123 Dry eye syndrome of bilateral lacrimal glands: Secondary | ICD-10-CM

## 2024-01-05 NOTE — Assessment & Plan Note
 IOP acceptable today OU monitoring off IOP drops   OCT n stable and HVF stable OS as well.   Continue present management  ?Prescription drug management done today reviewing current glaucoma medications for side effects, need for refills, and tolerating drops    Not on steroids and IOP good today,  continue off drops

## 2024-01-05 NOTE — Progress Notes
 Glaucoma History  -FH +  -Tmax:   -PMH Asthma,  OSA,  migraines  -Trauma   -CCT 514/491  -Steroid use   Previous Surgery/lasers  Trab OS for steroid responder  Medications  Allergies:   Allergies   Allergen Reactions    Ceclor [Cefaclor] RASH     Anaphylaxis    Prednisone SEE COMMENTS     Drops: tears as needed  Goal IOP: determine         Assessment and Plan:    Problem   Dry Eye Syndrome of Both Eyes   Steroid Responder, Left Eye   Retinal Phlebitis, Bilateral    Since October 2018    Extensive work up in [place]     Treated with prednisone 60 mg daily x 4 weeks last fall and resolved for a few weeks before returning    STK OS 12/18             Steroid responder, left eye  IOP acceptable today OU monitoring off IOP drops   OCT n stable and HVF stable OS as well.   Continue present management  ?Prescription drug management done today reviewing current glaucoma medications for side effects, need for refills, and tolerating drops    Not on steroids and IOP good today,  continue off drops    Retinal phlebitis, bilateral  Had not had anymore symptoms, not on steroids at this time,     Monitor for symptom recurrence    Dry eye syndrome of both eyes  Use over the counter artificial tears (NOT VISINE/CLEAR EYES). Preservative free tears are best. - examples, Systane / Refresh / Optive / Blink / store brand, 2-4x/day.  Avoid any drops that say gets the red out.                  NEXT VISIT   HVF 24-2 SITA Standard, DFE, and OCT - n and GCL 1 year            Garnette Fallow, MD   [place] [facility]      HPI:  Patient presents with:  Eye Exam: Pt states vision has not changed since last seen. Denies flashes of light or floaters in vision. Denies eye pain.         Exam:  Base Eye Exam       Visual Acuity (Snellen - Linear)         Right Left    Dist cc 20/20 -1 20/20      Correction: Glasses              Tonometry (iCare Tonometer, 8:03 AM)         Right Left    Pressure 13 11              Tonometry #2 (Applanation, 8:17 AM)         Right Left    Pressure 11 10              Pupils         Pupils Dark Light Shape React APD    Right PERRL 3.5 3 Round Brisk None    Left PERRL 3.5 3 Round Brisk None              Visual Fields    24.2             Neuro/Psych       Oriented x3: Yes    Mood/Affect: Normal  Dilation       Both eyes: 1.0% Tropicamide , 2.5% Phenylephrine  @ 8:04 AM                  Slit Lamp and Fundus Exam       External Exam         Right Left    External Normal Normal              Slit Lamp Exam         Right Left    Lids/Lashes Normal Normal    Conjunctiva/Sclera White and quiet sup bleb low diffuse, centrally cystic    Cornea Clear Clear    Anterior Chamber Deep and quiet Deep and quiet    Iris Flat patent superior PI    Lens Clear Posterior chamber intraocular lens, trc inferior PCO not in visual axis    Anterior Vitreous Normal Normal              Fundus Exam         Right Left    Disc Sharp, healthy rim, very full nerves, no obscurations of vessels sharp, healthy rim, full nerves, no obvscuration of vessels    C/D Ratio 0.1 0.2    Macula Flat Flat    Vessels Normal caliber and number Normal caliber and number    Periphery Attached no breaks or tears Attached no breaks or tears                  Refraction       Wearing Rx         Sphere Cylinder Axis Add    Right -1.50 Sphere      Left -1.75 +2.25 097 +2.50                    VISUAL FIELD, EXTEND          Humphrey Automated exam type was used.     Right Eye  Threshold was 24-2. Strategy was SITA Standard. III (aka 3). Reliability was good. Progression has been stable. Non-Specific Defects     Left Eye  Threshold was 24-2. Strategy was SITA Standard. III (aka 3). Reliability was borderline. Progression has been stable. Non-Specific Defects     Notes  Non reproducible defects OS,          OCT OPTIC NERVE          [place].     Optic Nerve  Right Eye  Optic nerve findings: Normal Progression has been stable.     Left Eye  Findings location Superior Optic nerve findings: Abnormal Thinning Progression has been stable.

## 2024-01-05 NOTE — Assessment & Plan Note
 Had not had anymore symptoms, not on steroids at this time,     Monitor for symptom recurrence

## 2024-01-05 NOTE — Assessment & Plan Note
 Use over the counter artificial tears (NOT VISINE/CLEAR EYES). Preservative free tears are best. - examples, Systane / Refresh / Optive / Blink / store brand, 2-4x/day.  Avoid any drops that say gets the red out.

## 2024-03-23 ENCOUNTER — Encounter: Admit: 2024-03-23 | Discharge: 2024-03-23 | Payer: BLUE CROSS/BLUE SHIELD
